# Patient Record
Sex: Male | Born: 1965
Health system: Southern US, Community
[De-identification: ages and names within clinical notes are randomized; demographics above are authoritative.]

## PROBLEM LIST (undated history)

## (undated) DIAGNOSIS — E119 Type 2 diabetes mellitus without complications: Secondary | ICD-10-CM

## (undated) DIAGNOSIS — N201 Calculus of ureter: Secondary | ICD-10-CM

## (undated) DIAGNOSIS — I10 Essential (primary) hypertension: Secondary | ICD-10-CM

## (undated) DIAGNOSIS — N400 Enlarged prostate without lower urinary tract symptoms: Secondary | ICD-10-CM

## (undated) DIAGNOSIS — E785 Hyperlipidemia, unspecified: Secondary | ICD-10-CM

## (undated) HISTORY — DX: Hyperlipidemia, unspecified: E78.5

## (undated) HISTORY — PX: BACK SURGERY: SHX140

## (undated) HISTORY — PX: LITHOTRIPSY: SUR834

## (undated) HISTORY — DX: Essential (primary) hypertension: I10

---

## 2004-04-24 ENCOUNTER — Emergency Department: Payer: Self-pay | Admitting: General Practice

## 2005-05-27 ENCOUNTER — Ambulatory Visit: Payer: Self-pay

## 2006-03-12 ENCOUNTER — Ambulatory Visit: Payer: Self-pay

## 2006-03-25 ENCOUNTER — Other Ambulatory Visit: Payer: Self-pay

## 2006-03-26 ENCOUNTER — Ambulatory Visit: Payer: Self-pay | Admitting: Urology

## 2006-10-17 ENCOUNTER — Ambulatory Visit: Payer: Self-pay | Admitting: Emergency Medicine

## 2007-02-20 IMAGING — CR ORBITS FOR FOREIGN BODY - 2 VIEW
1 series · 3 of 3 positions shown · non-contrast
Comparison: none

REASON FOR EXAM: Evaluate orbits for metallic foreign body prior to MRI
COMMENTS:

PROCEDURE:     DXR - DXR ORBITS FOR MRI CLEARANCE  - May 27, 2005  [DATE]
RESULT:          Multiple views were obtained which reveals no radiopaque
foreign bodies within the orbits to preclude an MRI examination.

[Series 1: view not recorded · 0.17mm/px · 3 of 3 slices shown]
[im 1/3]
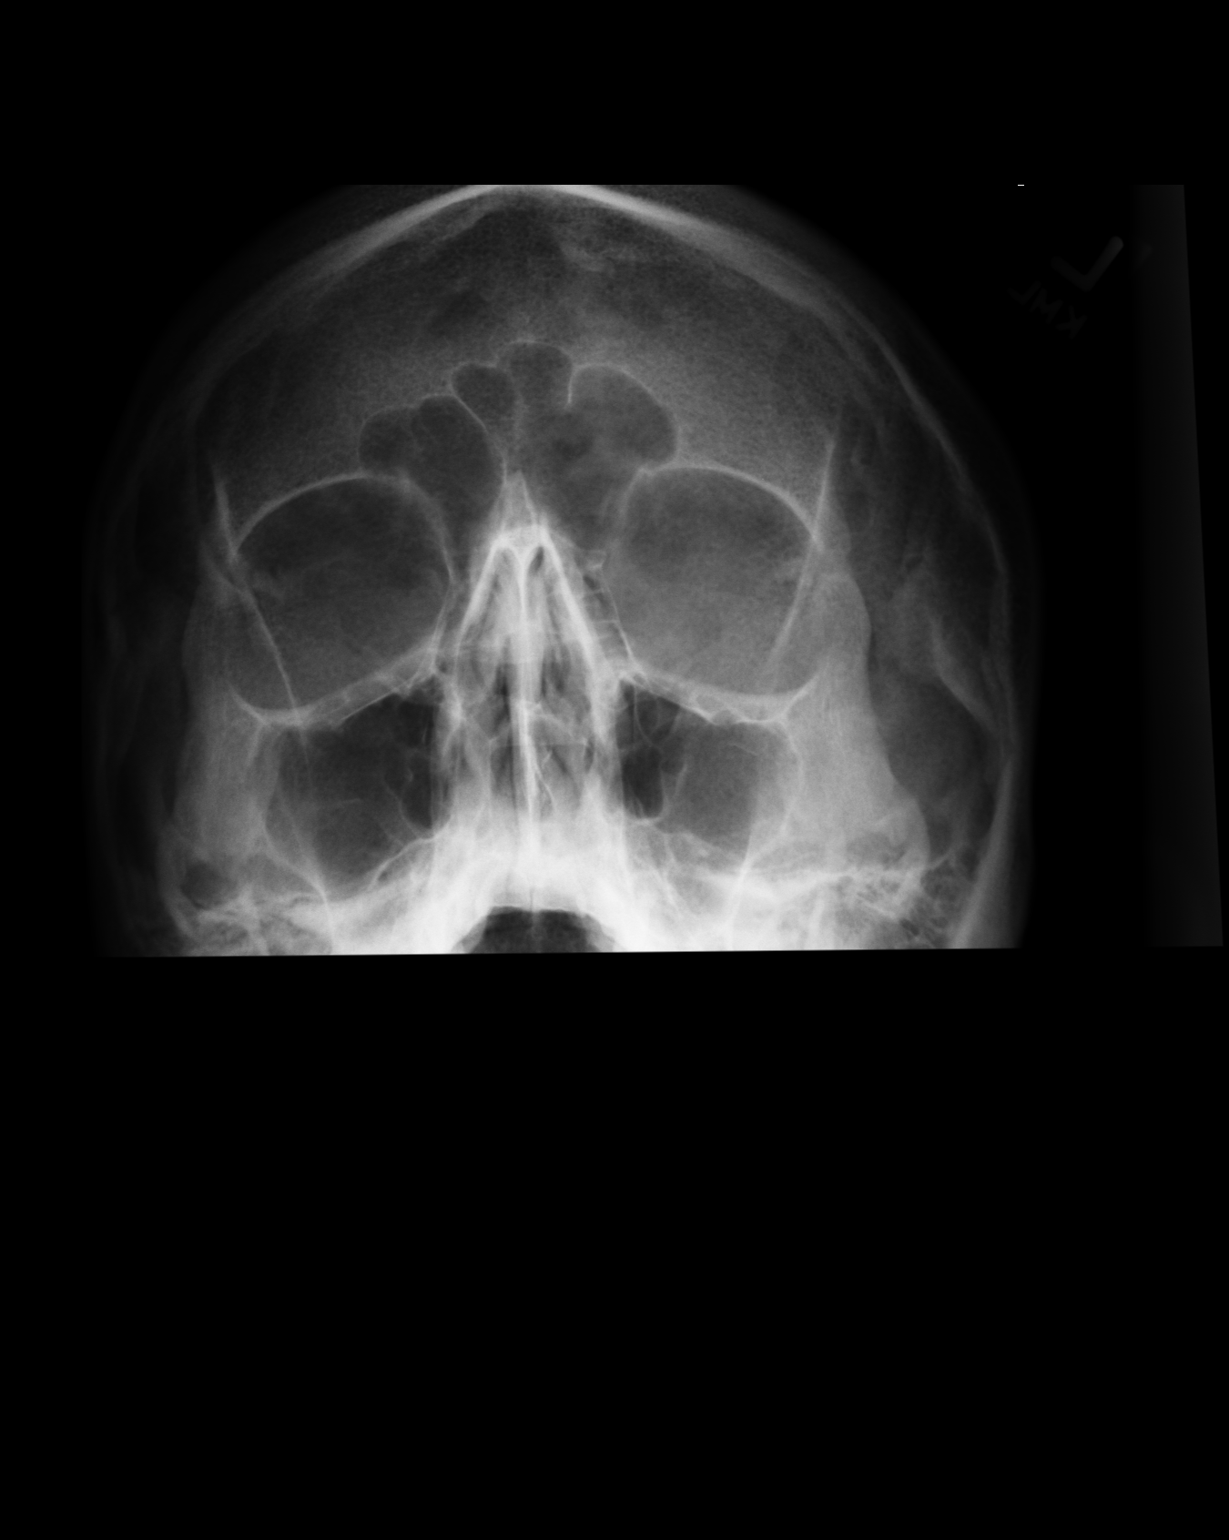
[im 2/3]
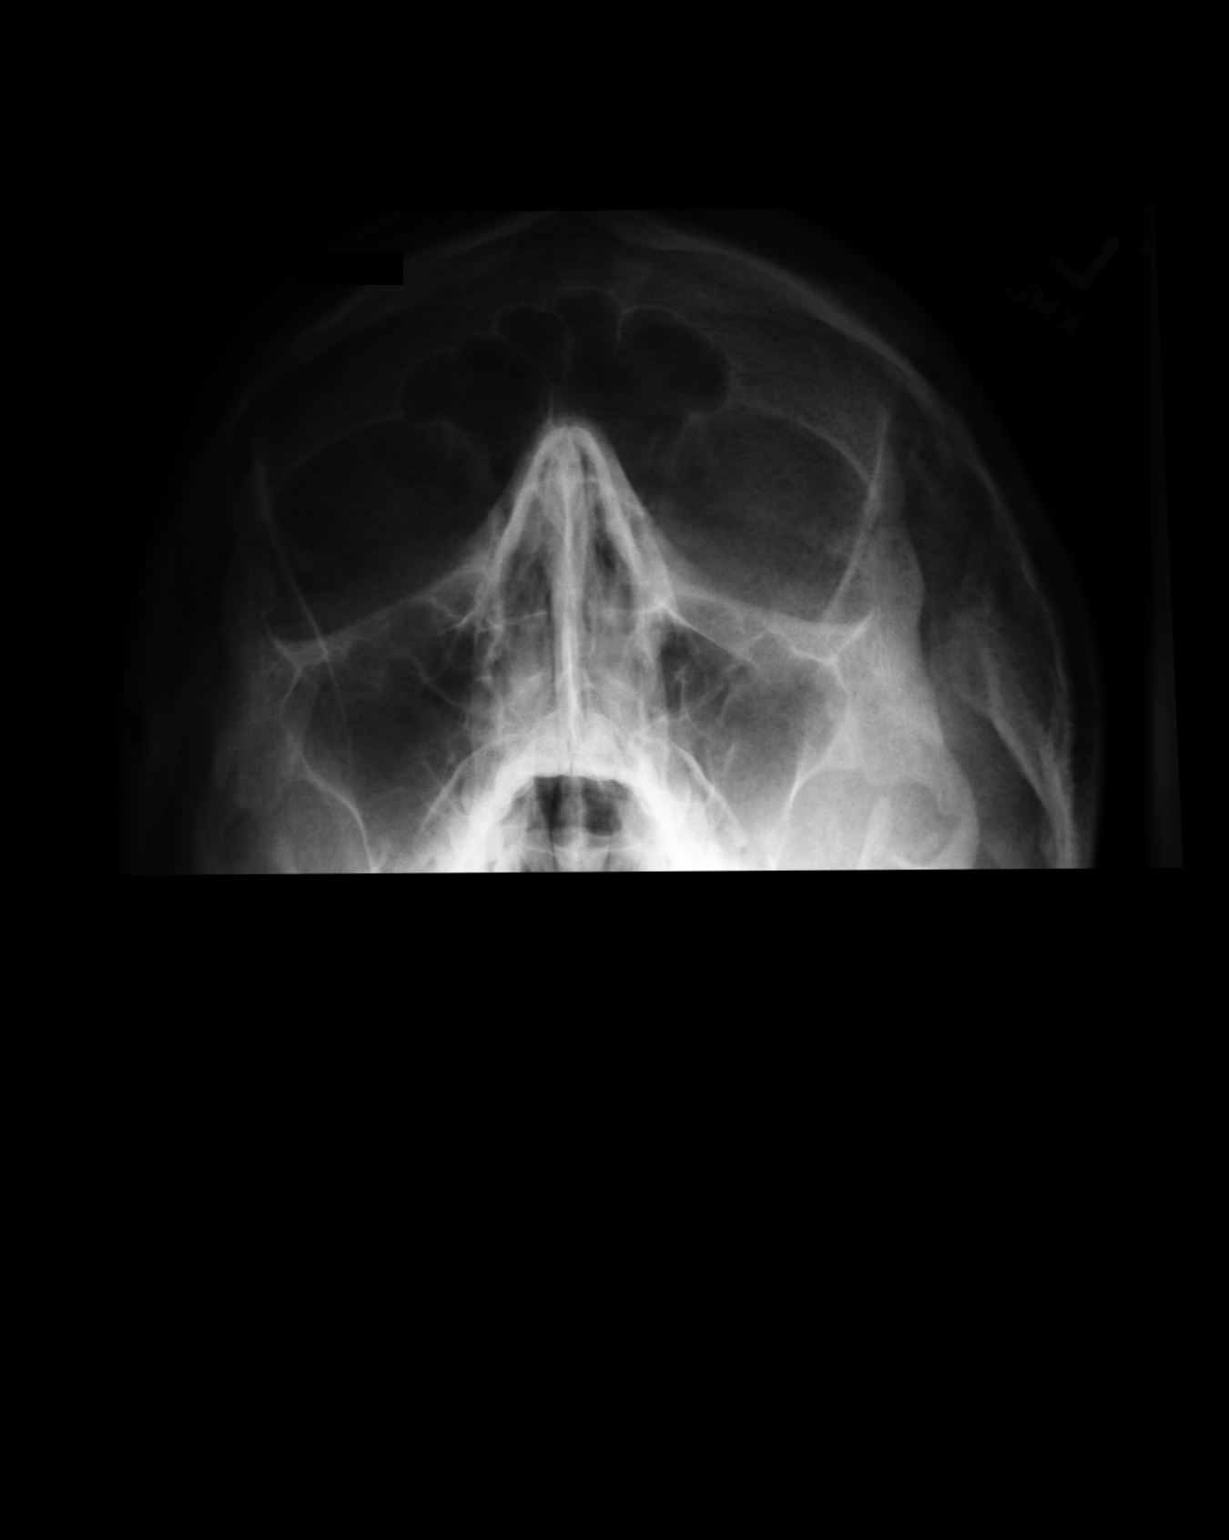
[im 3/3]
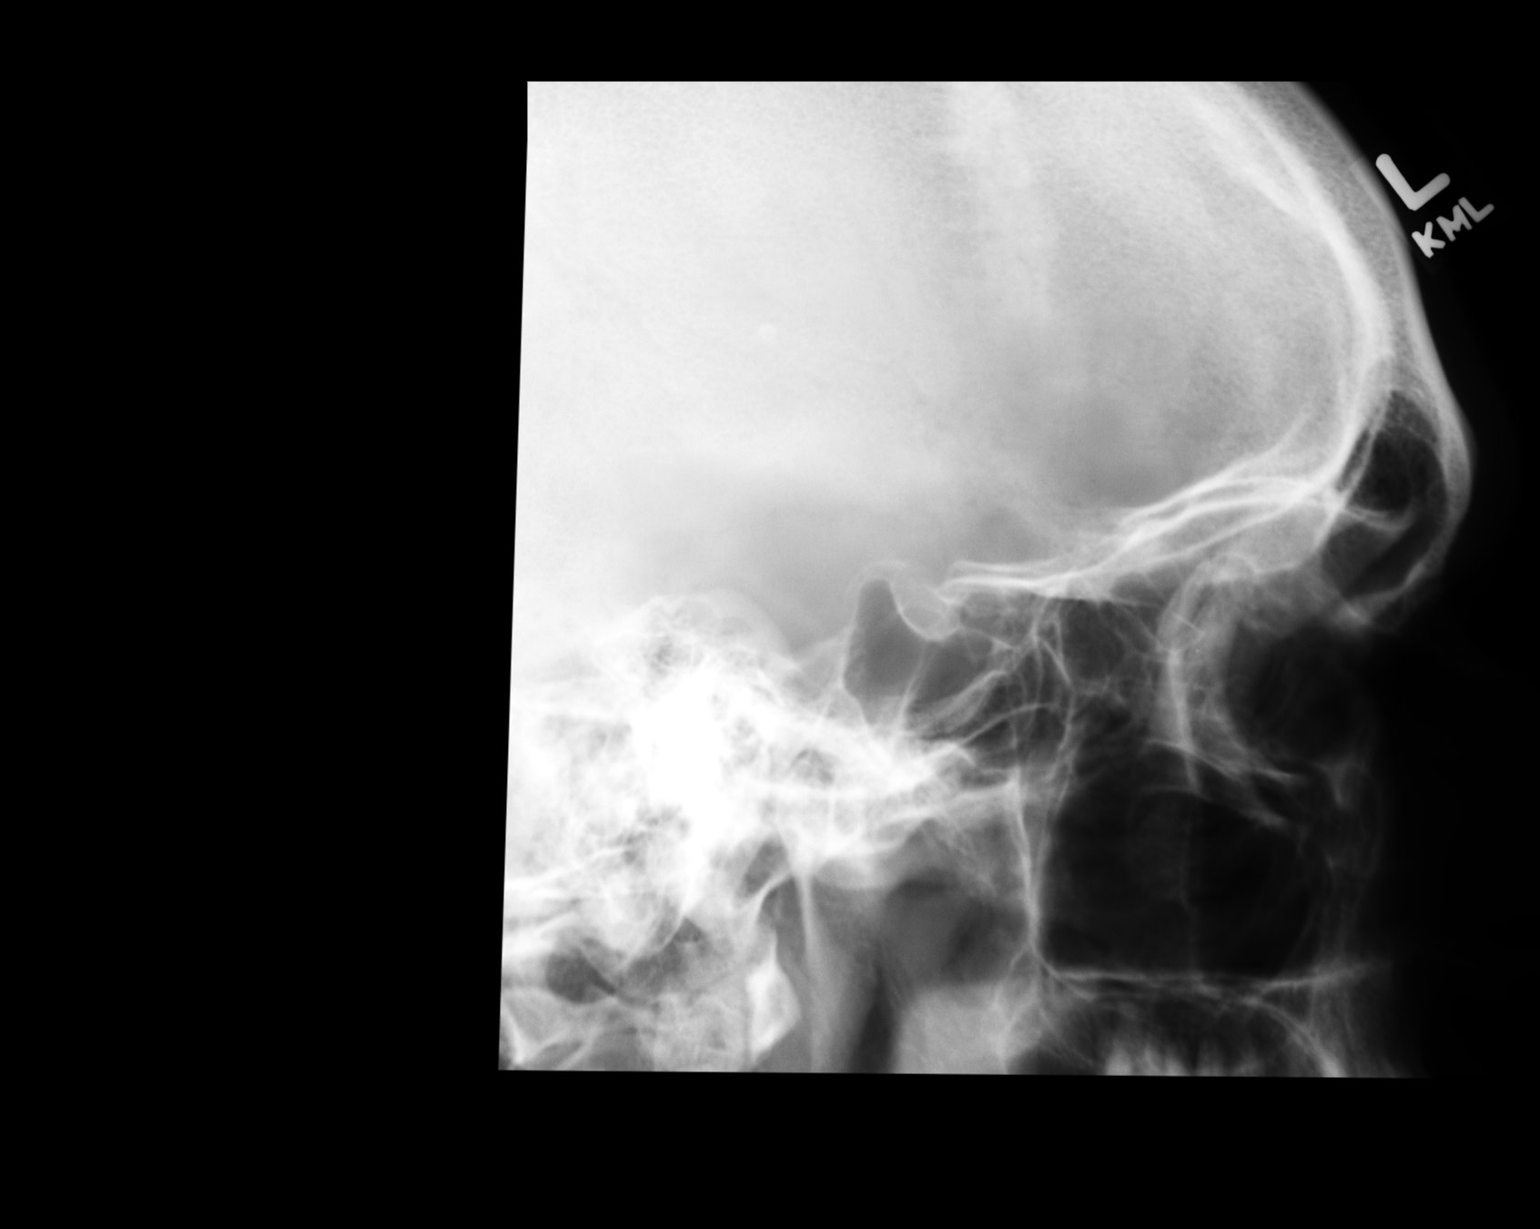

[3 of 3 positions shown; findings below may reference images not displayed]

IMPRESSION: As above.

## 2007-09-14 ENCOUNTER — Ambulatory Visit: Payer: Self-pay | Admitting: Urology

## 2007-09-15 ENCOUNTER — Ambulatory Visit: Payer: Self-pay | Admitting: Urology

## 2007-09-16 ENCOUNTER — Ambulatory Visit: Payer: Self-pay | Admitting: Urology

## 2007-12-20 IMAGING — CR DG ABDOMEN 1V
1 series · 1 of 1 positions shown · non-contrast
Comparison: none

REASON FOR EXAM: Renal calculi, lithotripsy
COMMENTS:

[view not recorded]
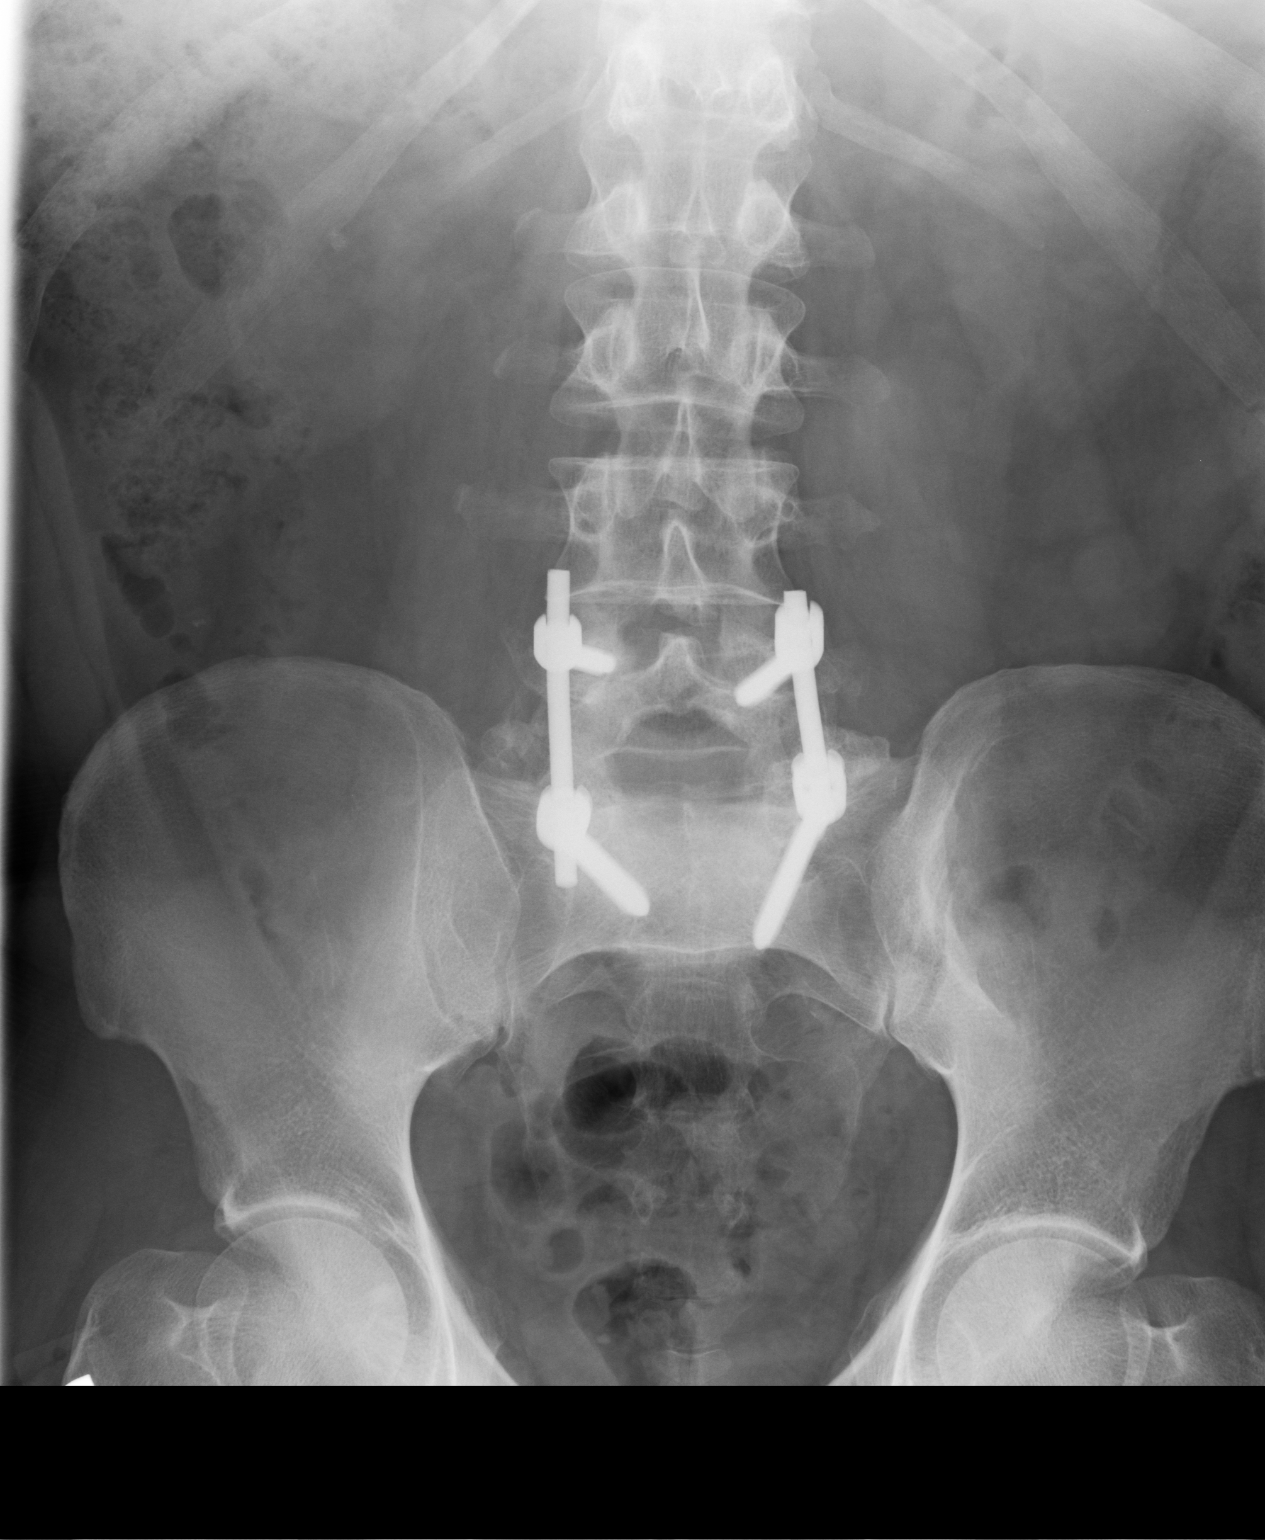

[1 of 1 positions shown; findings below may reference images not displayed]

PROCEDURE:     DXR - DXR KIDNEY URETER BLADDER  - March 26, 2006  [DATE]

RESULT:     AP view of the abdomen shows a calcification in the lower mid
pole region on the RIGHT compatible with a RIGHT renal stone as reported on
prior CT. The mid pole calcification on the LEFT, noted at CT, is not
definitely seen. There is a faint density present in the mid pole region
overlying the eleventh rib which may represent the stone noted at CT but
this is not definite. No ureteral stones are seen. There are post operative
changes of the lower lumbar spine.
IMPRESSION: 1.     There is observed a RIGHT renal stone as noted above.
2.     Possible faint, LEFT renal stone.

## 2009-05-28 ENCOUNTER — Ambulatory Visit: Payer: Self-pay | Admitting: Urology

## 2009-06-11 IMAGING — CR DG ABDOMEN 1V
1 series · 1 of 1 positions shown · non-contrast
Comparison: none

REASON FOR EXAM: Renal calculi-lithotripsy
COMMENTS:

[view not recorded]
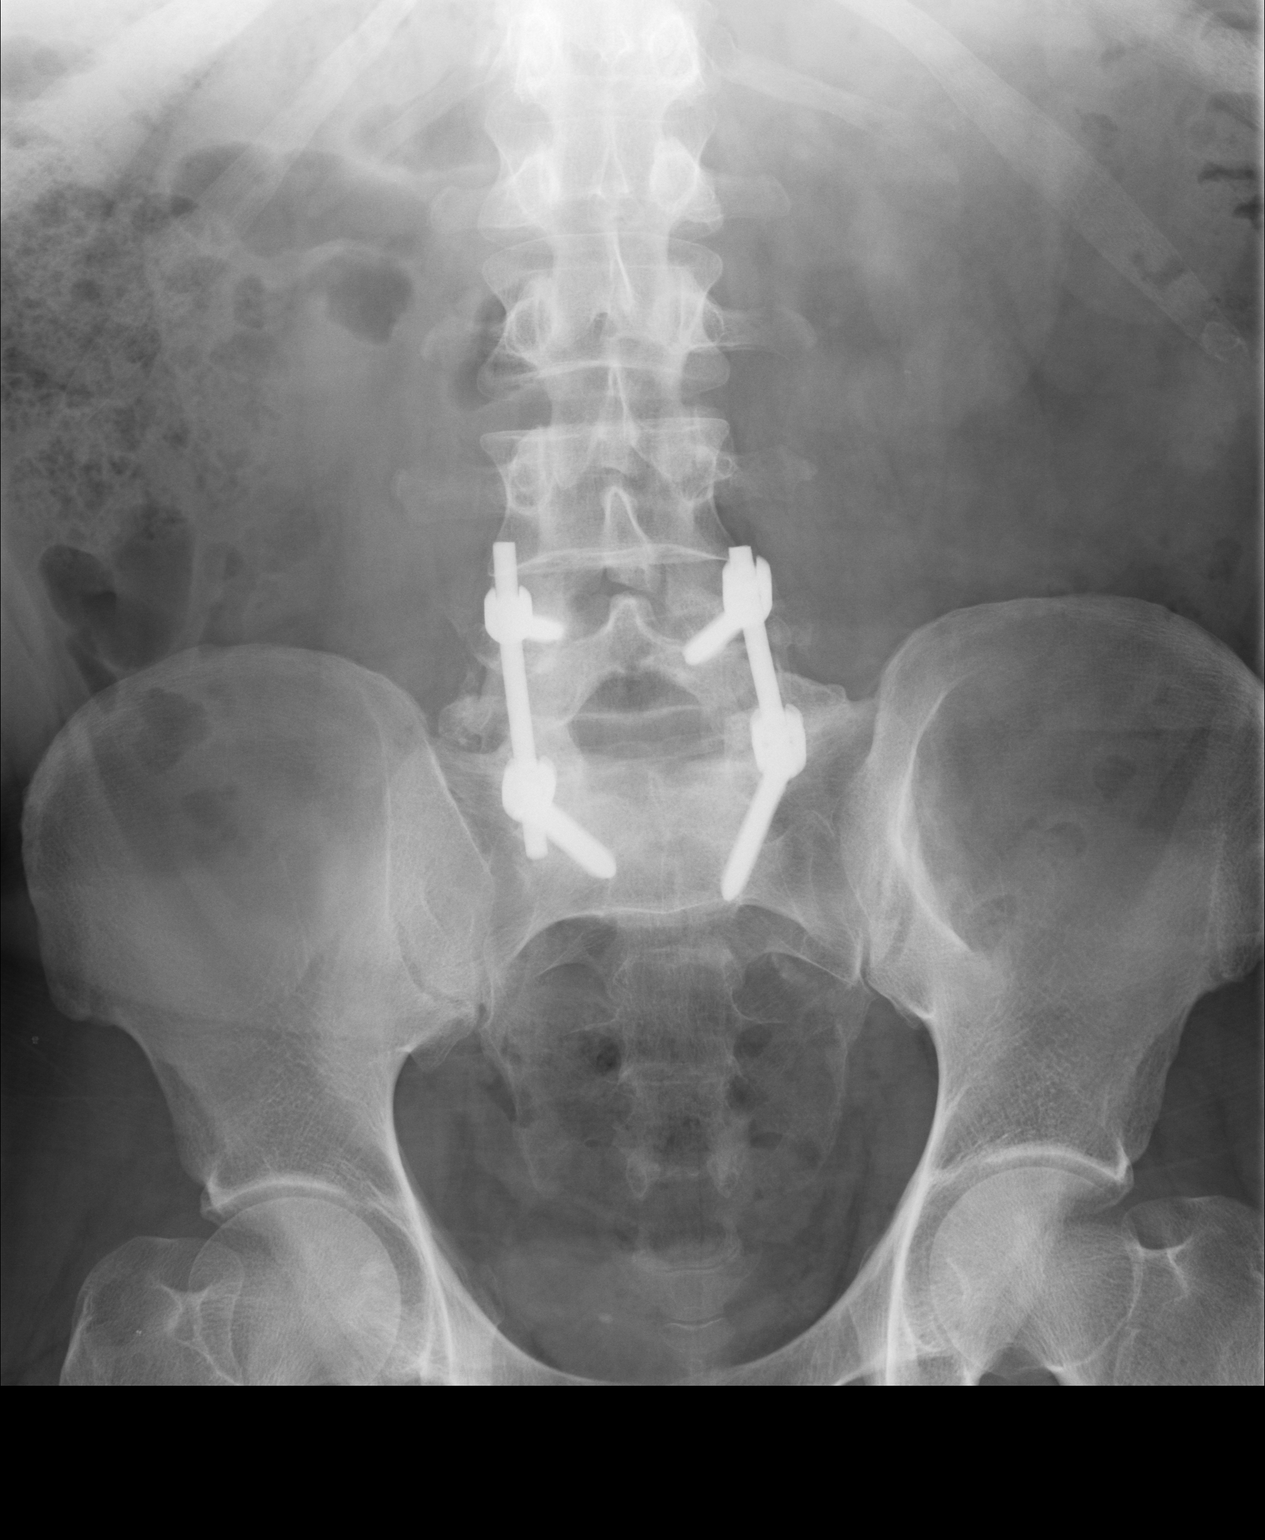

[1 of 1 positions shown; findings below may reference images not displayed]

PROCEDURE:     DXR - DXR KIDNEY URETER BLADDER  - September 16, 2007  [DATE]

RESULT:     Comparison is made to the prior exam of 03/26/2006.

The lower pole, RIGHT renal stone noted on the prior exam is not definitely
seen on the current study. The patient has known bilateral nephrolithiasis
from recent CT but the calcifications noted at CT are not definitely evident
on the current KUB. Post operative changes of the lower lumbar spine are
noted. There is a stable appearing lucent area in the LEFT ilium.
IMPRESSION: The patient has known nephrolithiasis. The bilateral renal
stones are not definitely identified on plain film examination.

## 2011-10-06 ENCOUNTER — Emergency Department: Payer: Self-pay | Admitting: Emergency Medicine

## 2013-07-01 IMAGING — CR DG LUMBAR SPINE 2-3V
1 series · 3 of 3 positions shown · non-contrast
Comparison: none

REASON FOR EXAM: low back pain, h/o fusion
COMMENTS:

[Series 1: t lumbar spine ap · 0.14mm/px · 3 of 3 slices shown]
[im 1/3]
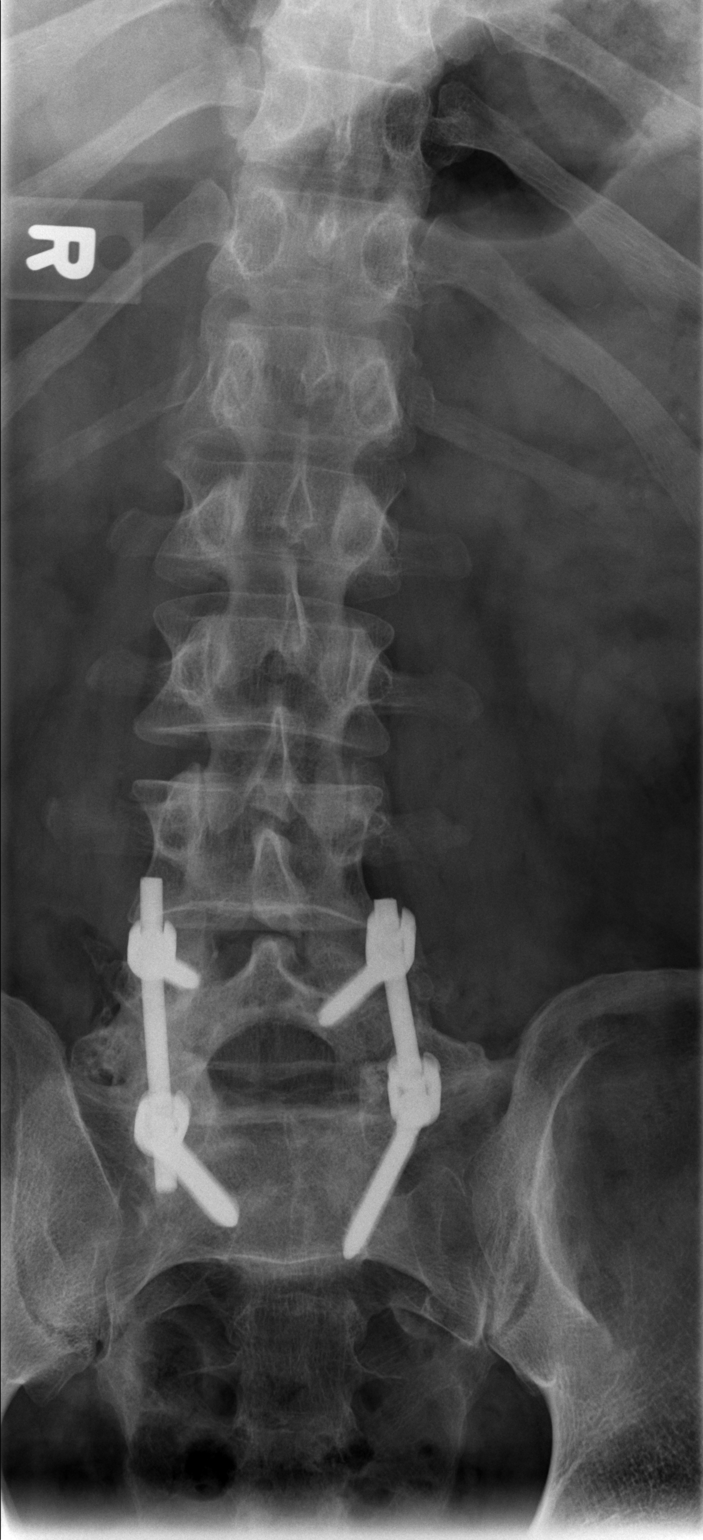
[im 2/3]
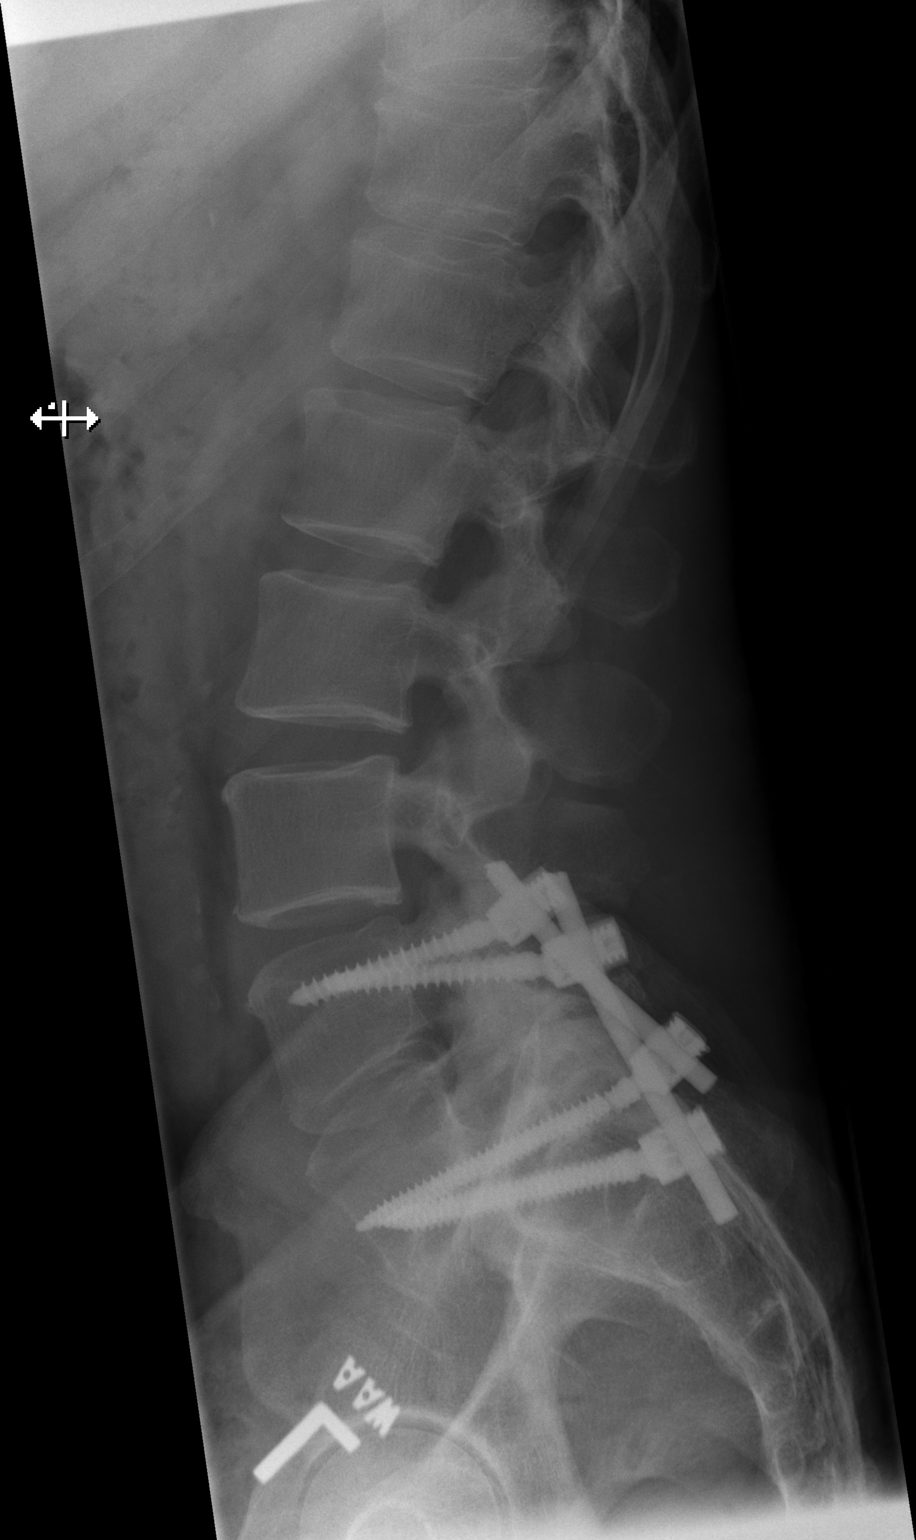
[im 3/3]
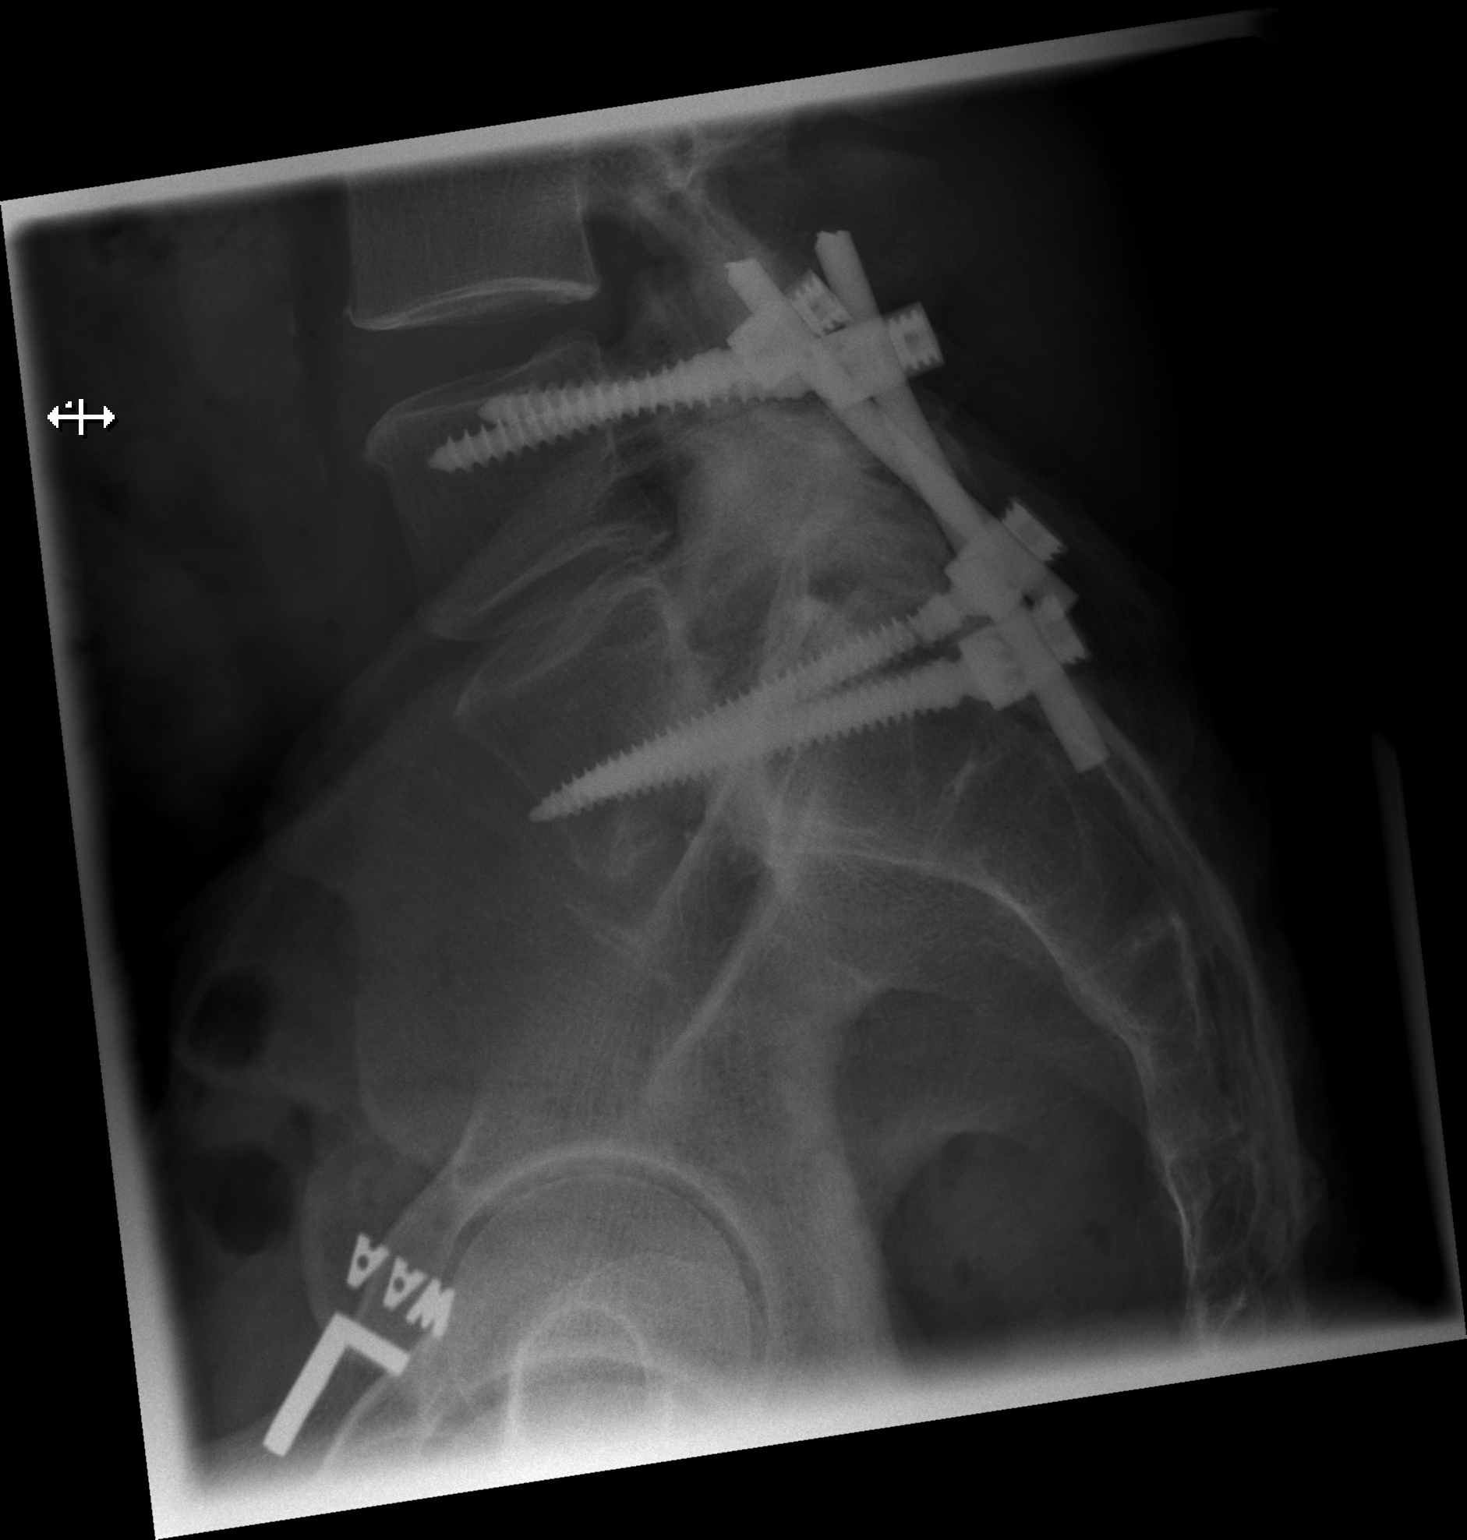

[3 of 3 positions shown; findings below may reference images not displayed]

PROCEDURE:     DXR - DXR LUMBAR SPINE AP AND LATERAL  - October 06, 2011 [DATE]

RESULT:     The lumbar vertebral bodies are preserved in height. There is
grade 3 anterolisthesis of L5 with respect to S1. The patient has undergone
previous posterior spinal fusion. One of the lower screws through the sacrum
appears broken. The intervertebral disc space heights in the upper and mid
lumbar spine are preserved. L4-L5 exhibits no more than mild narrowing. The
L5-S1 disc is disrupted with the anterolisthesis.
IMPRESSION: There is severe anterolisthesis at L5-S1. There is a
fracture through one of the pedicle screws through the upper sacrum.
Orthopedic evaluation is recommended.

## 2014-06-09 ENCOUNTER — Ambulatory Visit: Payer: Self-pay | Admitting: Gastroenterology

## 2016-10-02 ENCOUNTER — Encounter: Payer: Self-pay | Admitting: Urology

## 2016-10-02 ENCOUNTER — Ambulatory Visit
Admission: RE | Admit: 2016-10-02 | Discharge: 2016-10-02 | Disposition: A | Discharge status: Home / Self Care | Payer: BC Managed Care – PPO | Source: Ambulatory Visit | Attending: Urology | Admitting: Urology

## 2016-10-02 ENCOUNTER — Ambulatory Visit (INDEPENDENT_AMBULATORY_CARE_PROVIDER_SITE_OTHER): Payer: BC Managed Care – PPO | Admitting: Urology

## 2016-10-02 VITALS — BP 116/72 | HR 79 | Ht 71.0 in | Wt 228.6 lb

## 2016-10-02 DIAGNOSIS — N200 Calculus of kidney: Secondary | ICD-10-CM

## 2016-10-02 DIAGNOSIS — Z9889 Other specified postprocedural states: Secondary | ICD-10-CM | POA: Insufficient documentation

## 2016-10-02 LAB — MICROSCOPIC EXAMINATION
BACTERIA UA: NONE SEEN
EPITHELIAL CELLS (NON RENAL): NONE SEEN /HPF (ref 0–10)

## 2016-10-02 LAB — URINALYSIS, COMPLETE
Bilirubin, UA: NEGATIVE
Glucose, UA: NEGATIVE
Ketones, UA: NEGATIVE
LEUKOCYTES UA: NEGATIVE
Nitrite, UA: NEGATIVE
PH UA: 5.5 (ref 5.0–7.5)
PROTEIN UA: NEGATIVE
Specific Gravity, UA: 1.02 (ref 1.005–1.030)
UUROB: 0.2 mg/dL (ref 0.2–1.0)

## 2016-10-02 MED ORDER — TAMSULOSIN HCL 0.4 MG PO CAPS: 0.4000 mg | ORAL_CAPSULE | Freq: Every day | ORAL | 0 refills | Status: DC

## 2016-10-02 NOTE — Progress Notes (Signed)
10/02/2016 11:40 AM   Bradley Lynch [birth year] [id number]  Referring provider: No referring provider defined for this encounter.  Chief Complaint  Patient presents with  . Nephrolithiasis     HPI: The patient is a 51 year old male with a past medical history of nephrolithiasis who presents today for urgent work and as he feels that he is currently passing a stone.  His pain began 2 days ago and significant worsened yesterday. It is located in his left flank. He notes that he feels very similar previous episodes of renal colic. He denies nausea vomiting fevers and chills. He still having colicky intermittent left flank pain. It has been a number of years since he last had a stone or imaging. He has had ureteroscopy and lithotripsy the past. He is needed surgery for at least 8 stone episodes as well as having passed stones spontaneously. His stones are made of calcium oxalate. He had a negative metabolic workup at [facility] many years ago.  We arranged for him to undergo a KUB today which I have independently reviewed which showed a 1 mm calculus in the left upper pole of the kidney.  The radiologist, there are no other stones visualized. However on my review, there is an approximately 3 mm calcification in the expected path of the left ureter which potentially represents a distal ureteral calculus.  Urinalysis today shows no hematuria.  PMH: Past Medical History:  Diagnosis Date  . Hyperlipidemia   . Hypertension     Surgical History: Past Surgical History:  Procedure Laterality Date  . BACK SURGERY    . LITHOTRIPSY      Home Medications:  Allergies as of 10/02/2016      Reactions   Iodinated Diagnostic Agents       Medication List       Accurate as of 10/02/16 11:40 AM. Always use your most recent med list.          aspirin EC 81 MG tablet Take by mouth.   cetirizine 10 MG chewable tablet Commonly known as:  ZYRTEC Chew by mouth.   felodipine 5 MG 24 hr  tablet Commonly known as:  PLENDIL Take by mouth.   FISH OIL PO Take by mouth.   fluticasone 50 MCG/ACT nasal spray Commonly known as:  FLONASE Place into the nose.   gabapentin 300 MG capsule Commonly known as:  NEURONTIN 300 mg 2 (two) times daily.   lisinopril-hydrochlorothiazide 10-12.5 MG tablet Commonly known as:  PRINZIDE,ZESTORETIC Take by mouth.   simvastatin 40 MG tablet Commonly known as:  ZOCOR Take by mouth.   tamsulosin 0.4 MG Caps capsule Commonly known as:  FLOMAX Take 1 capsule (0.4 mg total) by mouth daily.       Allergies:  Allergies  Allergen Reactions  . Iodinated Diagnostic Agents     Family History: Family History  Problem Relation Age of Onset  . Bladder Cancer Neg Hx   . Prostate cancer Neg Hx   . Kidney cancer Neg Hx     Social History:  reports that he has quit smoking. He has quit using smokeless tobacco. He reports that he drinks alcohol. He reports that he does not use drugs.  ROS: UROLOGY Frequent Urination?: Yes Hard to postpone urination?: Yes Burning/pain with urination?: No Get up at night to urinate?: No Leakage of urine?: No Urine stream starts and stops?: No Trouble starting stream?: No Do you have to strain to urinate?: No Blood in urine?: No Urinary tract infection?:  No Sexually transmitted disease?: No Injury to kidneys or bladder?: No Painful intercourse?: No Weak stream?: No Erection problems?: No Penile pain?: No  Gastrointestinal Nausea?: No Vomiting?: No Indigestion/heartburn?: No Diarrhea?: No Constipation?: No  Constitutional Fever: No Night sweats?: No Weight loss?: No Fatigue?: No  Skin Skin rash/lesions?: Yes Itching?: Yes  Eyes Blurred vision?: No Double vision?: No  Ears/Nose/Throat Sore throat?: No Sinus problems?: Yes  Hematologic/Lymphatic Swollen glands?: No Easy bruising?: Yes  Cardiovascular Leg swelling?: No Chest pain?: No  Respiratory Cough?: No Shortness  of breath?: No  Endocrine Excessive thirst?: No  Musculoskeletal Back pain?: Yes Joint pain?: No  Neurological Headaches?: Yes Dizziness?: No  Psychologic Depression?: No Anxiety?: No  Physical Exam: BP 116/72 (BP Location: Left Arm, Patient Position: Sitting, Cuff Size: Normal)   Pulse 79   Ht 5\' 11"  (1.803 m)   Wt 228 lb 9.6 oz (103.7 kg)   BMI 31.88 kg/m   Constitutional:  Alert and oriented, No acute distress. HEENT: [place] AT, moist mucus membranes.  Trachea midline, no masses. Cardiovascular: No clubbing, cyanosis, or edema. Respiratory: Normal respiratory effort, no increased work of breathing. GI: Abdomen is soft, nontender, nondistended, no abdominal masses GU: Left CVA tenderness.  Skin: No rashes, bruises or suspicious lesions. Lymph: No cervical or inguinal adenopathy. Neurologic: Grossly intact, no focal deficits, moving all 4 extremities. Psychiatric: Normal mood and affect.  Laboratory Data: No results found for: WBC, HGB, HCT, MCV, PLT  No results found for: CREATININE  No results found for: PSA  No results found for: TESTOSTERONE  No results found for: HGBA1C  Urinalysis No results found for: COLORURINE, APPEARANCEUR, LABSPEC, PHURINE, GLUCOSEU, HGBUR, BILIRUBINUR, KETONESUR, PROTEINUR, UROBILINOGEN, NITRITE, LEUKOCYTESUR   Assessment & Plan:    1. Left flank pain 2. Left 1 mm nonobstructing upper pole stone 3. Possible 3 mm distal left ureteral calculus I discussed the patient that his KUB was somewhat inconclusive though there is a potential distal left ureteral calculus. I discussed options with him which include assuming that it is a stone and beginning medical expulsive therapy with tamsulosin versus obtaining a CT scan for more definitive imaging. At this point, the patient would like to try Flomax and repeat his KUB in approximately 2 weeks. I think this is very reasonable. I did discuss with the patient that he would need more definitive  imaging via CT scan if he did elect to undergo any surgical treatment for the potential distal left ureteral stone. He will follow-up in 2 weeks and will be started on Flomax. Hopefully this calcified area is no longer present on repeat KUB.   Return in about 2 weeks (around 10/16/2016) for KUB prior.  Hildred LaserBrian Cloyce Iyla Balzarini, MD  [facility] [address], [address] [place], [place] [address] [phone]

## 2016-10-24 ENCOUNTER — Ambulatory Visit: Payer: BC Managed Care – PPO

## 2018-06-28 IMAGING — CR DG ABDOMEN 1V
1 series · 2 of 2 positions shown · non-contrast
Comparison: May 28, 2009

CLINICAL DATA: Abdominal pain

EXAM:
ABDOMEN - 1 VIEW

[Series 1: dg abd 1 view · 0.14mm/px · 2 of 2 slices shown]
[im 1/2]
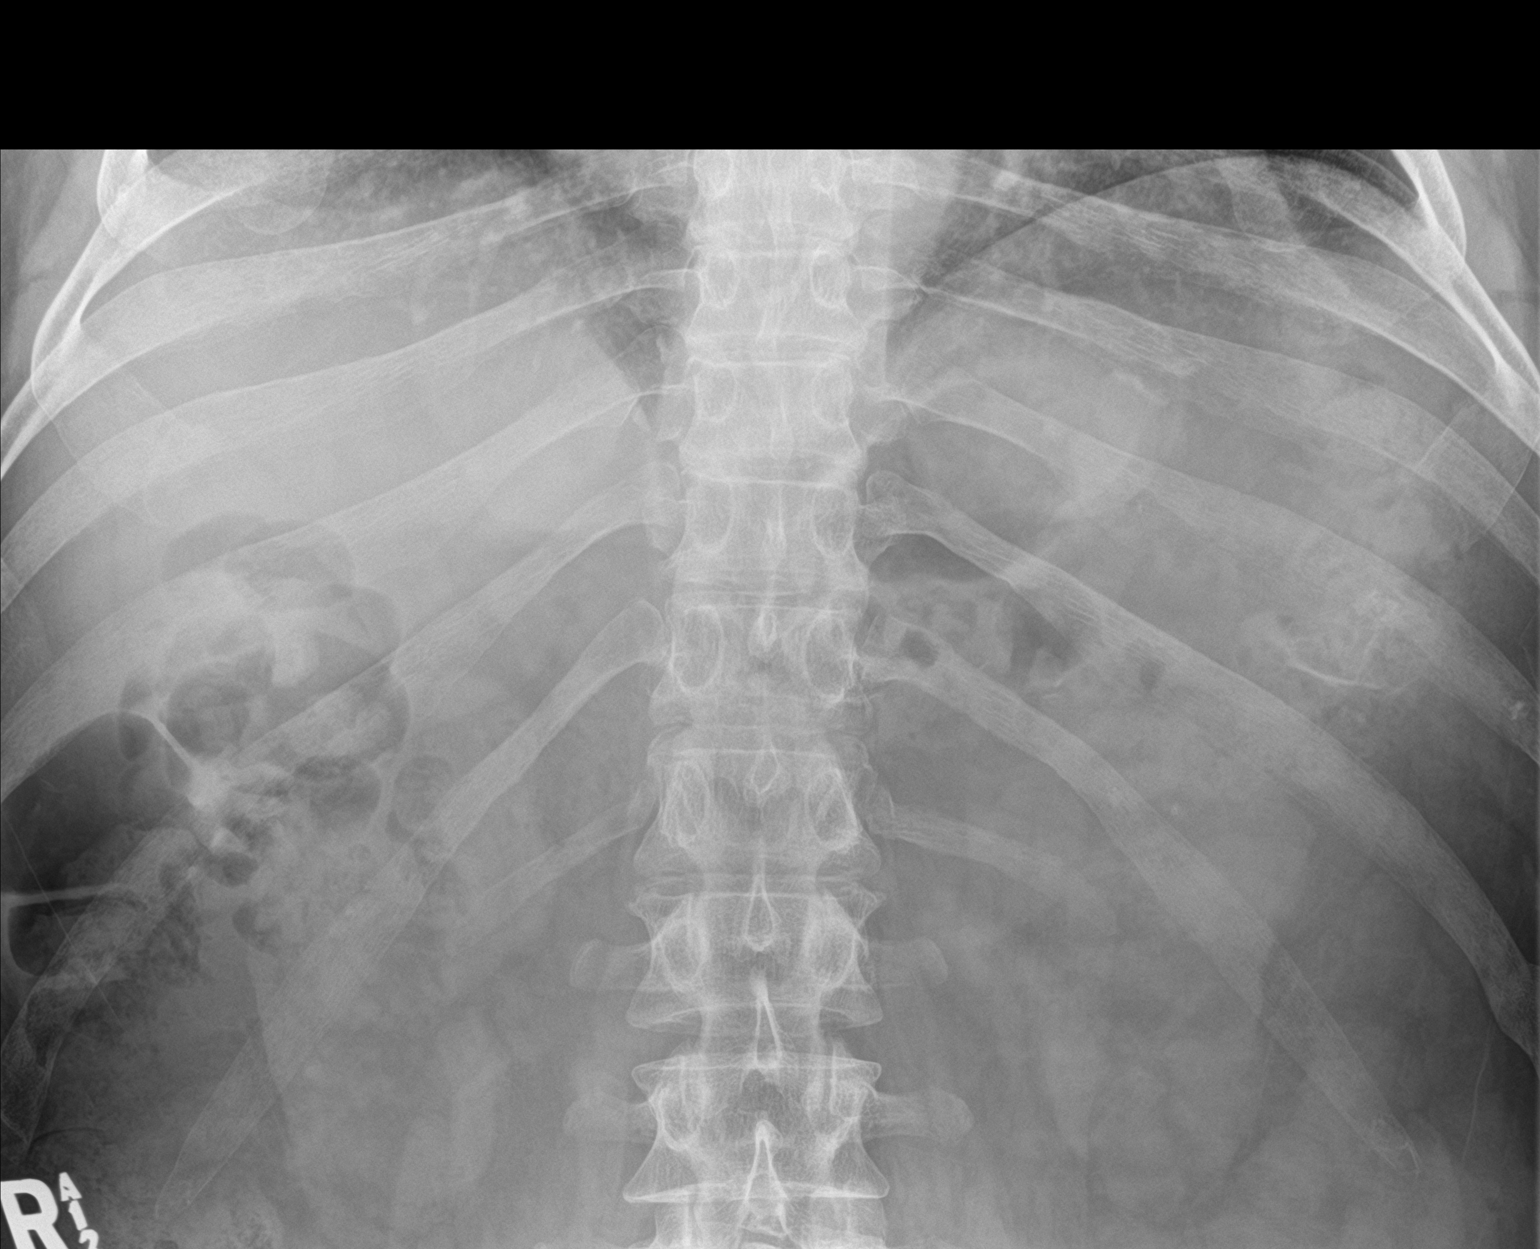
[im 2/2]
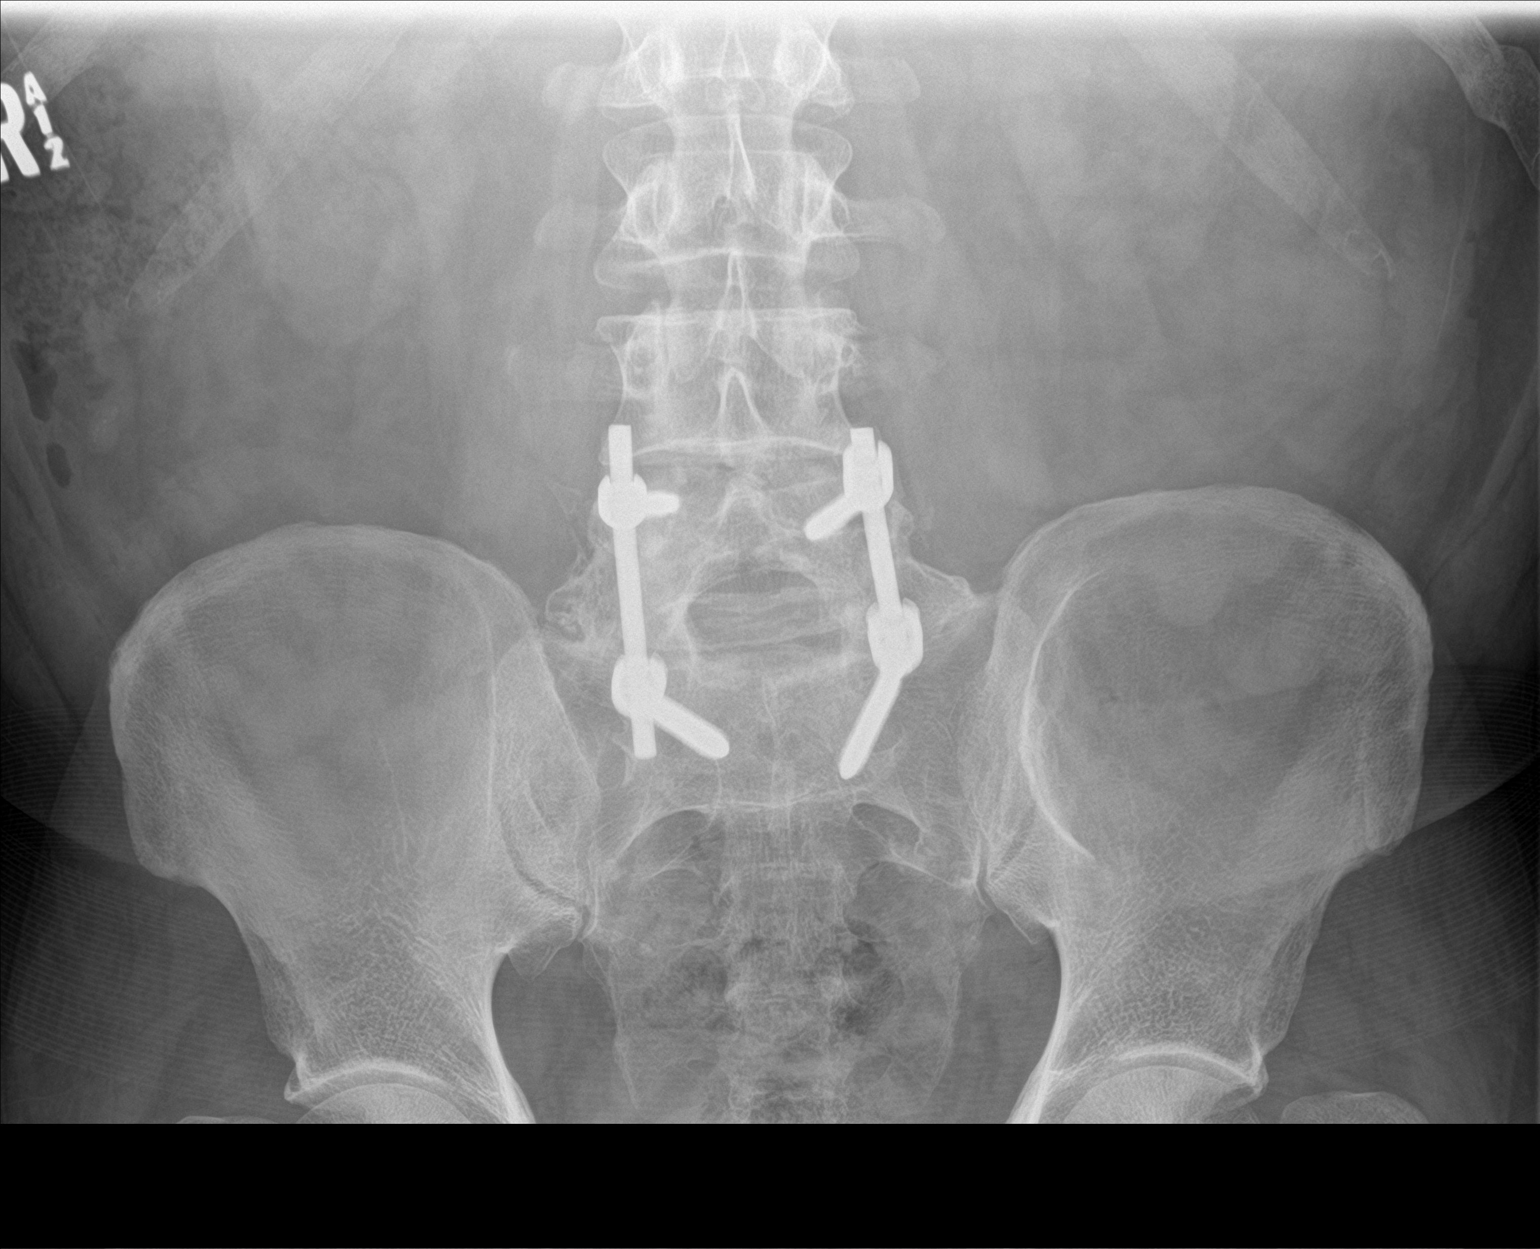

[2 of 2 positions shown; findings below may reference images not displayed]

FINDINGS: There is a 1 mm calcification in the region of the upper pole of the
left kidney. No other abnormal calcifications are evident. There is
moderate stool in the colon. There is no bowel dilatation or
air-fluid level suggesting bowel obstruction. No free air. Lung
bases are clear. There is postoperative change at L5 and S1.
IMPRESSION: 1 mm calculus upper pole left kidney. No bowel obstruction or free
air. Postoperative change at lumbosacral junction.

## 2019-07-28 ENCOUNTER — Encounter: Payer: Self-pay | Admitting: Urology

## 2019-07-28 ENCOUNTER — Other Ambulatory Visit: Payer: Self-pay

## 2019-07-28 ENCOUNTER — Ambulatory Visit
Admission: RE | Admit: 2019-07-28 | Discharge: 2019-07-28 | Disposition: A | Discharge status: Home / Self Care | Payer: BC Managed Care – PPO | Attending: Urology | Admitting: Urology

## 2019-07-28 ENCOUNTER — Encounter
Admission: RE | Disposition: A | Discharge status: Home / Self Care | Payer: Self-pay | Source: Home / Self Care | Attending: Urology

## 2019-07-28 DIAGNOSIS — N132 Hydronephrosis with renal and ureteral calculous obstruction: Secondary | ICD-10-CM | POA: Insufficient documentation

## 2019-07-28 DIAGNOSIS — E119 Type 2 diabetes mellitus without complications: Secondary | ICD-10-CM | POA: Insufficient documentation

## 2019-07-28 DIAGNOSIS — N200 Calculus of kidney: Secondary | ICD-10-CM | POA: Diagnosis present

## 2019-07-28 DIAGNOSIS — I10 Essential (primary) hypertension: Secondary | ICD-10-CM | POA: Diagnosis not present

## 2019-07-28 DIAGNOSIS — Z91041 Radiographic dye allergy status: Secondary | ICD-10-CM | POA: Diagnosis not present

## 2019-07-28 DIAGNOSIS — Z7982 Long term (current) use of aspirin: Secondary | ICD-10-CM | POA: Insufficient documentation

## 2019-07-28 DIAGNOSIS — Z7984 Long term (current) use of oral hypoglycemic drugs: Secondary | ICD-10-CM | POA: Insufficient documentation

## 2019-07-28 DIAGNOSIS — Z79899 Other long term (current) drug therapy: Secondary | ICD-10-CM | POA: Diagnosis not present

## 2019-07-28 DIAGNOSIS — Z87891 Personal history of nicotine dependence: Secondary | ICD-10-CM | POA: Insufficient documentation

## 2019-07-28 HISTORY — PX: EXTRACORPOREAL SHOCK WAVE LITHOTRIPSY: SHX1557

## 2019-07-28 LAB — GLUCOSE, CAPILLARY: Glucose-Capillary: 136 mg/dL — ABNORMAL HIGH (ref 70–99)

## 2019-07-28 SURGERY — LITHOTRIPSY, ESWL
Anesthesia: Moderate Sedation | Laterality: Left

## 2019-07-28 MED ORDER — DIPHENHYDRAMINE HCL 25 MG PO CAPS: 25.0000 mg | ORAL_CAPSULE | Freq: Once | ORAL | Status: AC

## 2019-07-28 MED ORDER — PROMETHAZINE HCL 25 MG/ML IJ SOLN: 25.0000 mg | Freq: Once | INTRAMUSCULAR | Status: AC

## 2019-07-28 MED ORDER — MIDAZOLAM HCL 2 MG/2ML IJ SOLN: 1.0000 mg | Freq: Once | INTRAMUSCULAR | Status: AC

## 2019-07-28 MED ORDER — PROMETHAZINE HCL 25 MG/ML IJ SOLN
INTRAMUSCULAR | Status: AC
  Administered 2019-07-28: 25 mg via INTRAMUSCULAR
  Filled 2019-07-28: qty 1

## 2019-07-28 MED ORDER — MIDAZOLAM HCL 2 MG/2ML IJ SOLN
INTRAMUSCULAR | Status: AC
  Administered 2019-07-28: 1 mg via INTRAMUSCULAR
  Filled 2019-07-28: qty 2

## 2019-07-28 MED ORDER — HYDROMORPHONE HCL 2 MG PO TABS
2.0000 mg | ORAL_TABLET | Freq: Once | ORAL | Status: AC
  Administered 2019-07-28: 2 mg via ORAL
  Filled 2019-07-28: qty 1

## 2019-07-28 MED ORDER — HYDROMORPHONE HCL 2 MG PO TABS
ORAL_TABLET | ORAL | Status: AC
  Filled 2019-07-28: qty 1

## 2019-07-28 MED ORDER — LEVOFLOXACIN 500 MG PO TABS
ORAL_TABLET | ORAL | Status: AC
  Administered 2019-07-28: 11:00:00 500 mg via ORAL
  Filled 2019-07-28: qty 1

## 2019-07-28 MED ORDER — MORPHINE SULFATE (PF) 10 MG/ML IV SOLN
INTRAVENOUS | Status: AC
  Administered 2019-07-28: 10 mg
  Filled 2019-07-28: qty 1

## 2019-07-28 MED ORDER — DEXTROSE-NACL 5-0.45 % IV SOLN: INTRAVENOUS | Status: DC

## 2019-07-28 MED ORDER — DIPHENHYDRAMINE HCL 25 MG PO CAPS
ORAL_CAPSULE | ORAL | Status: AC
  Administered 2019-07-28: 25 mg via ORAL
  Filled 2019-07-28: qty 1

## 2019-07-28 MED ORDER — FUROSEMIDE 10 MG/ML IJ SOLN
INTRAMUSCULAR | Status: AC
  Filled 2019-07-28: qty 2

## 2019-07-28 MED ORDER — LEVOFLOXACIN 500 MG PO TABS: 500.0000 mg | ORAL_TABLET | Freq: Once | ORAL | Status: AC

## 2019-07-28 MED ORDER — FUROSEMIDE 10 MG/ML IJ SOLN
10.0000 mg | Freq: Once | INTRAMUSCULAR | Status: AC
  Administered 2019-07-28: 10 mg via INTRAVENOUS

## 2019-07-28 MED ORDER — MORPHINE SULFATE (PF) 2 MG/ML IV SOLN
10.0000 mg | Freq: Once | INTRAVENOUS | Status: DC
  Filled 2019-07-28: qty 5

## 2019-07-28 NOTE — Discharge Instructions (Addendum)
Lithotripsy, Care After This sheet gives you information about how to care for yourself after your procedure. Your health care provider may also give you more specific instructions. If you have problems or questions, contact your health care provider. What can I expect after the procedure? After the procedure, it is common to have:  Some blood in your urine. This should only last for a few days.  Soreness in your back, sides, or upper abdomen for a few days.  Blotches or bruises on your back where the pressure wave entered the skin.  Pain, discomfort, or nausea when pieces (fragments) of the kidney [place] move through the tube that carries urine from the kidney to the bladder (ureter). [place] fragments may pass soon after the procedure, but they may continue to pass for up to 4-8 weeks. ? If you have severe pain or nausea, contact your health care provider. This may be caused by a large [place] that was not broken up, and this may mean that you need more treatment.  Some pain or discomfort during urination.  Some pain or discomfort in the lower abdomen or (in men) at the base of the penis. Follow these instructions at home: Medicines  Take over-the-counter and prescription medicines only as told by your health care provider.  If you were prescribed an antibiotic medicine, take it as told by your health care provider. Do not stop taking the antibiotic even if you start to feel better.  Do not drive for 24 hours if you were given a medicine to help you relax (sedative).  Do not drive or use heavy machinery while taking prescription pain medicine. Eating and drinking      Drink enough water and fluids to keep your urine clear or pale yellow. This helps any remaining pieces of the [place] to pass. It can also help prevent new stones from forming.  Eat plenty of fresh fruits and vegetables.  Follow instructions from your health care provider about eating and drinking restrictions. You may be  instructed: ? To reduce how much salt (sodium) you eat or drink. Check ingredients and nutrition facts on packaged foods and beverages. ? To reduce how much meat you eat.  Eat the recommended amount of calcium for your age and gender. Ask your health care provider how much calcium you should have. General instructions  Get plenty of rest.  Most people can resume normal activities 1-2 days after the procedure. Ask your health care provider what activities are safe for you.  Your health care provider may direct you to lie in a certain position (postural drainage) and tap firmly (percuss) over your kidney area to help [place] fragments pass. Follow instructions as told by your health care provider.  If directed, strain all urine through the strainer that was provided by your health care provider. ? Keep all fragments for your health care provider to see. Any stones that are found may be sent to a medical lab for examination. The [place] may be as small as a grain of salt.  Keep all follow-up visits as told by your health care provider. This is important. Contact a health care provider if:  You have pain that is severe or does not get better with medicine.  You have nausea that is severe or does not go away.  You have blood in your urine longer than your health care provider told you to expect.  You have more blood in your urine.  You have pain during urination that does   not go away.  You urinate more frequently than usual and this does not go away.  You develop a rash or any other possible signs of an allergic reaction. Get help right away if:  You have severe pain in your back, sides, or upper abdomen.  You have severe pain while urinating.  Your urine is very dark red.  You have blood in your stool (feces).  You cannot pass any urine at all.  You feel a strong urge to urinate after emptying your bladder.  You have a fever or chills.  You develop shortness of breath,  difficulty breathing, or chest pain.  You have severe nausea that leads to persistent vomiting.  You faint. Summary  After this procedure, it is common to have some pain, discomfort, or nausea when pieces (fragments) of the kidney stone move through the tube that carries urine from the kidney to the bladder (ureter). If this pain or nausea is severe, however, you should contact your health care provider.  Most people can resume normal activities 1-2 days after the procedure. Ask your health care provider what activities are safe for you.  Drink enough water and fluids to keep your urine clear or pale yellow. This helps any remaining pieces of the stone to pass, and it can help prevent new stones from forming.  If directed, strain your urine and keep all fragments for your health care provider to see. Fragments or stones may be as small as a grain of salt.  Get help right away if you have severe pain in your back, sides, or upper abdomen or have severe pain while urinating. This information is not intended to replace advice given to you by your health care provider. Make sure you discuss any questions you have with your health care provider. Document Revised: 06/28/2018 Document Reviewed: 02/06/2016 Elsevier Patient Education  2020 [employer]   AMBULATORY SURGERY  DISCHARGE INSTRUCTIONS   1) The drugs that you were given will stay in your system until tomorrow so for the next 24 hours you should not:  A) Drive an automobile B) Make any legal decisions C) Drink any alcoholic beverage   2) You may resume regular meals tomorrow.  Today it is better to start with liquids and gradually work up to solid foods.  You may eat anything you prefer, but it is better to start with liquids, then soup and crackers, and gradually work up to solid foods.   3) Please notify your doctor immediately if you have any unusual bleeding, trouble breathing, redness and pain at the surgery site,  drainage, fever, or pain not relieved by medication.    4) Additional Instructions:   FOLLOW [facility] INSTRUCTION SHEET AS REVIEWED.        Please contact your physician with any problems or Same Day Surgery at [phone], Monday through Friday 6 am to 4 pm, or [place] at [facility] number at [phone].

## 2019-12-21 ENCOUNTER — Other Ambulatory Visit: Payer: BC Managed Care – PPO

## 2019-12-21 DIAGNOSIS — Z20822 Contact with and (suspected) exposure to covid-19: Secondary | ICD-10-CM

## 2019-12-24 LAB — NOVEL CORONAVIRUS, NAA: SARS-CoV-2, NAA: NOT DETECTED

## 2019-12-28 ENCOUNTER — Other Ambulatory Visit: Payer: BC Managed Care – PPO

## 2019-12-28 DIAGNOSIS — Z20822 Contact with and (suspected) exposure to covid-19: Secondary | ICD-10-CM

## 2019-12-29 LAB — SARS-COV-2, NAA 2 DAY TAT

## 2019-12-29 LAB — NOVEL CORONAVIRUS, NAA: SARS-CoV-2, NAA: NOT DETECTED

## 2020-01-04 ENCOUNTER — Other Ambulatory Visit: Payer: BC Managed Care – PPO

## 2020-01-04 DIAGNOSIS — Z20822 Contact with and (suspected) exposure to covid-19: Secondary | ICD-10-CM

## 2020-01-05 LAB — SARS-COV-2, NAA 2 DAY TAT

## 2020-01-05 LAB — NOVEL CORONAVIRUS, NAA: SARS-CoV-2, NAA: NOT DETECTED

## 2020-01-11 ENCOUNTER — Other Ambulatory Visit: Payer: BC Managed Care – PPO

## 2020-01-11 DIAGNOSIS — Z20822 Contact with and (suspected) exposure to covid-19: Secondary | ICD-10-CM

## 2020-01-12 LAB — NOVEL CORONAVIRUS, NAA: SARS-CoV-2, NAA: NOT DETECTED

## 2020-01-12 LAB — SARS-COV-2, NAA 2 DAY TAT

## 2020-01-18 ENCOUNTER — Other Ambulatory Visit: Payer: BC Managed Care – PPO

## 2020-01-18 DIAGNOSIS — Z20822 Contact with and (suspected) exposure to covid-19: Secondary | ICD-10-CM

## 2020-01-19 LAB — SARS-COV-2, NAA 2 DAY TAT

## 2020-01-19 LAB — NOVEL CORONAVIRUS, NAA: SARS-CoV-2, NAA: NOT DETECTED

## 2020-01-25 ENCOUNTER — Other Ambulatory Visit: Payer: BC Managed Care – PPO

## 2020-01-25 DIAGNOSIS — Z20822 Contact with and (suspected) exposure to covid-19: Secondary | ICD-10-CM

## 2020-01-26 LAB — NOVEL CORONAVIRUS, NAA: SARS-CoV-2, NAA: NOT DETECTED

## 2020-01-26 LAB — SARS-COV-2, NAA 2 DAY TAT

## 2022-04-15 ENCOUNTER — Encounter: Payer: Self-pay | Admitting: Urology

## 2022-04-15 ENCOUNTER — Ambulatory Visit (INDEPENDENT_AMBULATORY_CARE_PROVIDER_SITE_OTHER): Payer: BC Managed Care – PPO | Admitting: Urology

## 2022-04-15 VITALS — BP 145/86 | HR 106 | Ht 71.0 in | Wt 226.6 lb

## 2022-04-15 DIAGNOSIS — Z87442 Personal history of urinary calculi: Secondary | ICD-10-CM | POA: Diagnosis not present

## 2022-04-15 DIAGNOSIS — N529 Male erectile dysfunction, unspecified: Secondary | ICD-10-CM | POA: Diagnosis not present

## 2022-04-15 DIAGNOSIS — N200 Calculus of kidney: Secondary | ICD-10-CM

## 2022-04-15 MED ORDER — TADALAFIL 20 MG PO TABS: 20.0000 mg | ORAL_TABLET | ORAL | 6 refills | Status: DC

## 2022-04-15 MED ORDER — TADALAFIL 20 MG PO TABS: 20.0000 mg | ORAL_TABLET | ORAL | 6 refills | Status: AC

## 2022-04-15 NOTE — Progress Notes (Signed)
   04/15/22 12:40 PM   Jeneen Rinks Chelsea Aus [birth year] [id number]  CC: Erectile dysfunction, low testosterone, history of nephrolithiasis  HPI: 57 year old male who reports 1 to 2 years of problems with erectile dysfunction and maintaining erections.  He was reportedly trialed on sildenafil, Cialis, Levitra by PCP with only minimal improvement, the doses of those medications are not available to me.  He was using all of them on an as-needed basis.  A free testosterone was borderline low, but no total testosterone labs have been drawn.  He also reports an extensive history of kidney stones requiring at least 10 shockwave procedures in the past.  He reportedly has had multiple 71-GGYI urine metabolic test as well, and he says these were not helpful.   PMH: Past Medical History:  Diagnosis Date   Hyperlipidemia    Hypertension     Surgical History: Past Surgical History:  Procedure Laterality Date   BACK SURGERY     EXTRACORPOREAL SHOCK WAVE LITHOTRIPSY Left 07/28/2019   Procedure: EXTRACORPOREAL SHOCK WAVE LITHOTRIPSY (ESWL);  Surgeon: Royston Cowper, MD;  Location: [facility] ORS;  Service: Urology;  Laterality: Left;   LITHOTRIPSY       Family History: Family History  Problem Relation Age of Onset   Bladder Cancer Neg Hx    Prostate cancer Neg Hx    Kidney cancer Neg Hx     Social History:  reports that he has quit smoking. He has quit using smokeless tobacco. He reports current alcohol use. He reports that he does not use drugs.  Physical Exam: BP (!) 145/86 (BP Location: Left Arm, Patient Position: Sitting, Cuff Size: Large)   Pulse (!) 106   Ht 5\' 11"  (1.803 m)   Wt 226 lb 9.6 oz (102.8 kg)   BMI 31.60 kg/m    Constitutional:  Alert and oriented, No acute distress. Cardiovascular: No clubbing, cyanosis, or edema. Respiratory: Normal respiratory effort, no increased work of breathing. GI: Abdomen is soft, nontender, nondistended, no abdominal masses  Assessment & Plan:    57 year old male with at least 1 to 2 years of problems with erections.  We reviewed the AUA guidelines, and I also recommended checking a morning total testosterone.  I recommended starting with Cialis 20 mg every other day to see if we can improve some of the erections while we await further workup with the testosterone lab.  We also reviewed behavioral strategies including more of a plant-based diet, exercise, reducing stress, adequate sleep.  We discussed general stone prevention strategies including adequate hydration with goal of producing 2.5 L of urine daily, increasing citric acid intake, increasing calcium intake during high oxalate meals, minimizing animal protein, and decreasing salt intake. Information about dietary recommendations given today.   -Trial of Cialis 20 mg every other day -Check morning testosterone level, if low will coordinate follow-up with PA for repeat labs and consider testosterone replacement -Stone prevention strategies discussed, consider yearly KUB in the future with his recurrent stone disease  Nickolas Madrid, MD 04/15/2022  [place] [address], [place], [place] [address] [phone]

## 2022-04-15 NOTE — Patient Instructions (Addendum)
The Benefits of a Plant-Based Diet for Urology Health  A plant-based diet emphasizes the consumption of whole, unprocessed plant foods while minimizing or excluding animal products including meat and dairy products. This dietary approach has gained attention for its potential to promote overall health, including urology-related conditions. Incorporating a plant-based diet into your lifestyle can offer numerous benefits for maintaining optimal urology health.  1. Reduced Risk of Kidney Stones: A plant-based diet is typically rich in fruits, vegetables, legumes, and whole grains. These foods are high in dietary fiber, potassium, and magnesium, which can help reduce the risk of developing kidney stones. Be careful to avoid high quantities of spinach, as these can contribute to kidney stone formation if eaten in large volumes. The increased intake of water-soluble fiber can enhance the excretion of waste products and prevent the crystallization of minerals that lead to stone formation.  2. Improved Prostate Health: Studies have suggested a link between the consumption of red and processed meats and an increased risk of prostate problems, including benign prostatic hyperplasia (BPH) and prostate cancer. By adopting a plant-based diet, you can lower your intake of saturated fats and decrease the risk of these conditions. PSA levels can often decrease on plant based diets! Plant foods are also rich in antioxidants and phytochemicals that have been associated with prostate health.  3. Better Bladder Function: A diet focused on plant-based foods can contribute to better bladder health by reducing the risk of urinary tract infections (UTIs). Berries, citrus fruits, and leafy greens are known for their high vitamin C content, which can acidify urine and create an environment less favorable for bacteria growth. Additionally, plant-based diets are generally lower in sodium, which can help prevent fluid retention and  reduce the strain on the bladder.  4. Management of Erectile Dysfunction (ED): Some research suggests that a plant-based diet can positively impact erectile function. Plant-based diets are associated with improved cardiovascular health, which is crucial for maintaining healthy blood flow and nerve function required for proper erectile function. By reducing the consumption of high-cholesterol and high-saturated fat animal products, a plant-based diet may contribute to a decreased risk of ED.  5. Prevention of Chronic Conditions: A plant-based diet can help prevent or manage chronic conditions such as obesity, diabetes, and hypertension. These conditions can contribute to urology-related issues, including urinary incontinence and kidney dysfunction. By maintaining a healthy weight and managing these conditions, you can reduce the risk of urology-related complications.  Conclusion: Embracing a plant-based diet can offer significant benefits for urology health. By incorporating a variety of colorful fruits, vegetables, whole grains, nuts, seeds, and legumes into your meals, you can support kidney health, prostate health, bladder function, and overall well-being. Remember to consult with a healthcare professional or registered dietitian before making any significant dietary changes, especially if you have existing health conditions. Your personalized approach to a plant-based diet can contribute to improved urology health and enhance your quality of life.       Hypogonadism, Male  Male hypogonadism is a condition of having a level of testosterone that is lower than normal. Testosterone is a chemical, or hormone, that is made mainly in the testicles. In boys, testosterone is responsible for the development of male characteristics during puberty. These include: Making the penis bigger. Growing and building the muscles. Growing facial hair. Deepening the voice. In adult men, testosterone is  responsible for maintaining: An interest in sex and the ability to have sex. Muscle mass. Sperm production. Red blood cell production. Bone strength.  Testosterone also gives men energy and a sense of well-being. Testosterone normally decreases as men age and the testicles make less testosterone. Testosterone levels can vary from man to man. Not all men will have signs and symptoms of low testosterone. Weight, alcohol use, medicines, and certain medical conditions can affect a man's testosterone level. What are the causes? This condition is caused by: A natural decrease in testosterone that occurs as a man grows older. This is the main cause of this condition. Use of medicines, such as antidepressants, steroids, and opioids. Diseases and conditions that affect the testicles or the making of testosterone. These include: Injury or damage to the testicles from trauma, cancer, cancer treatment, or infection. Diabetes. Sleep apnea. Genetic conditions that men are born with. Disease of the pituitary gland. This gland is in the brain. It produces hormones. Obesity. Metabolic syndrome. This is a group of diseases that affect blood pressure, blood sugar, cholesterol, and belly fat. HIV or AIDS. Alcohol abuse. Kidney failure. Other long-term or chronic diseases. What are the signs or symptoms? Common symptoms of this condition include: Loss of interest in sex (low sex drive). Inability to have or maintain an erection (erectile dysfunction). Feeling tired (fatigue). Mood changes, like irritability or depression. Loss of muscle and body hair. Infertility. Large breasts. Weight gain (obesity). How is this diagnosed? Your health care provider can diagnose hypogonadism based on: Your signs and symptoms. A physical exam to check your testosterone levels. This includes blood tests. Testosterone levels can change throughout the day. Levels are highest in the morning. You may need to have repeat  blood tests before getting a diagnosis of hypogonadism. Depending on your medical history and test results, your health care provider may also do other tests to find the cause of low testosterone. How is this treated? This condition is treated with testosterone replacement therapy. Testosterone can be given by: Injection or through pellets inserted under the skin. Gels or patches placed on the skin or in the mouth. Testosterone therapy is not for everyone. It has risks and side effects. Your health care provider will consider your medical history, your risk for prostate cancer, your age, and your symptoms before putting you on testosterone replacement therapy. Follow these instructions at home: Take over-the-counter and prescription medicines only as told by your health care provider. Eat foods that are high in fiber, such as beans, whole grains, and fresh fruits and vegetables. Limit foods that are high in fat and processed sugars, such as fried or sweet foods. If you drink alcohol: Limit how much you have to 0-2 drinks a day. Know how much alcohol is in your drink. In the [place] one drink equals one 12 oz bottle of beer (355 mL), one 5 oz glass of wine (148 mL), or one 1 oz glass of hard liquor (44 mL). Return to your normal activities as told by your health care provider. Ask your health care provider what activities are safe for you. Keep all follow-up visits. This is important. Contact a health care provider if: You have any of the signs or symptoms of low testosterone. You have any side effects from testosterone therapy. Summary Male hypogonadism is a condition of having a level of testosterone that is lower than normal. The natural drop in testosterone production that occurs with age is the most common cause of this condition. Low testosterone can also be caused by many diseases and conditions that affect the testicles and the making of testosterone. This condition is treated with  testosterone replacement therapy. There are risks and side effects of testosterone therapy. Your health care provider will consider your age, medical history, symptoms, and risks for prostate cancer before putting you on testosterone therapy. This information is not intended to replace advice given to you by your health care provider. Make sure you discuss any questions you have with your health care provider. Document Revised: 11/17/2019 Document Reviewed: 11/17/2019 Elsevier Patient Education  [place].  Erectile Dysfunction Erectile dysfunction (ED) is the inability to get or keep an erection in order to have sexual intercourse. ED is considered a symptom of an underlying disorder and is not considered a disease. ED may include: Inability to get an erection. Lack of enough hardness of the erection to allow penetration. Loss of erection before sex is finished. What are the causes? This condition may be caused by: Physical causes, such as: Artery problems. This may include heart disease, high blood pressure, atherosclerosis, and diabetes. Hormonal problems, such as low testosterone. Obesity. Nerve problems. This may include back or pelvic injuries, multiple sclerosis, Parkinson's disease, spinal cord injury, and stroke. Certain medicines, such as: Pain relievers. Antidepressants. Blood pressure medicines and water pills (diuretics). Cancer medicines. Antihistamines. Muscle relaxants. Lifestyle factors, such as: Use of drugs such as marijuana, cocaine, or opioids. Excessive use of alcohol. Smoking. Lack of physical activity or exercise. Psychological causes, such as: Anxiety or stress. Sadness or depression. Exhaustion. Fear about sexual performance. Guilt. What are the signs or symptoms? Symptoms of this condition include: Inability to get an erection. Lack of enough hardness of the erection to allow penetration. Loss of the erection before sex is  finished. Sometimes having normal erections, but with frequent unsatisfactory episodes. Low sexual satisfaction in either partner due to erection problems. A curved penis occurring with erection. The curve may cause pain, or the penis may be too curved to allow for intercourse. Never having nighttime or morning erections. How is this diagnosed? This condition is often diagnosed by: Performing a physical exam to find other diseases or specific problems with the penis. Asking you detailed questions about the problem. Doing tests, such as: Blood tests to check for diabetes mellitus or high cholesterol, or to measure hormone levels. Other tests to check for underlying health conditions. An ultrasound exam to check for scarring. A test to check blood flow to the penis. Doing a sleep study at home to measure nighttime erections. How is this treated? This condition may be treated by: Medicines, such as: Medicine taken by mouth to help you achieve an erection (oral medicine). Hormone replacement therapy to replace low testosterone levels. Medicine that is injected into the penis. Your health care provider may instruct you how to give yourself these injections at home. Medicine that is delivered with a short applicator tube. The tube is inserted into the opening at the tip of the penis, which is the opening of the urethra. A tiny pellet of medicine is put in the urethra. The pellet dissolves and enhances erectile function. This is also called MUSE (medicated urethral system for erections) therapy. Vacuum pump. This is a pump with a ring on it. The pump and ring are placed on the penis and used to create pressure that helps the penis become erect. Penile implant surgery. In this procedure, you may receive: An inflatable implant. This consists of cylinders, a pump, and a reservoir. The cylinders can be inflated with a fluid that helps to create an erection, and they can be deflated after intercourse. A  semi-rigid implant. This consists of two silicone rubber rods. The rods provide some rigidity. They are also flexible, so the penis can both curve downward in its normal position and become straight for sexual intercourse. Blood vessel surgery to improve blood flow to the penis. During this procedure, a blood vessel from a different part of the body is placed into the penis to allow blood to flow around (bypass) damaged or blocked blood vessels. Lifestyle changes, such as exercising more, losing weight, and quitting smoking. Follow these instructions at home: Medicines  Take over-the-counter and prescription medicines only as told by your health care provider. Do not increase the dosage without first discussing it with your health care provider. If you are using self-injections, do injections as directed by your health care provider. Make sure you avoid any veins that are on the surface of the penis. After giving an injection, apply pressure to the injection site for 5 minutes. Talk to your health care provider about how to prevent headaches while taking ED medicines. These medicines may cause a sudden headache due to the increase in blood flow in your body. General instructions Exercise regularly, as directed by your health care provider. Work with your health care provider to lose weight, if needed. Do not use any products that contain nicotine or tobacco. These products include cigarettes, chewing tobacco, and vaping devices, such as e-cigarettes. If you need help quitting, ask your health care provider. Before using a vacuum pump, read the instructions that come with the pump and discuss any questions with your health care provider. Keep all follow-up visits. This is important. Contact a health care provider if: You feel nauseous. You are vomiting. You get sudden headaches while taking ED medicines. You have any concerns about your sexual health. Get help right away if: You are taking oral or  injectable medicines and you have an erection that lasts longer than 4 hours. If your health care provider is unavailable, go to the nearest emergency room for evaluation. An erection that lasts much longer than 4 hours can result in permanent damage to your penis. You have severe pain in your groin or abdomen. You develop redness or severe swelling of your penis. You have redness spreading at your groin or lower abdomen. You are unable to urinate. You experience chest pain or a rapid heartbeat (palpitations) after taking oral medicines. These symptoms may represent a serious problem that is an emergency. Do not wait to see if the symptoms will go away. Get medical help right away. Call your local emergency services (911 in the [place] Do not drive yourself to the hospital. Summary Erectile dysfunction (ED) is the inability to get or keep an erection during sexual intercourse. This condition is diagnosed based on a physical exam, your symptoms, and tests to determine the cause. Treatment varies depending on the cause and may include medicines, hormone therapy, surgery, or a vacuum pump. You may need follow-up visits to make sure that you are using your medicines or devices correctly. Get help right away if you are taking or injecting medicines and you have an erection that lasts longer than 4 hours. This information is not intended to replace advice given to you by your health care provider. Make sure you discuss any questions you have with your health care provider. Document Revised: 06/13/2020 Document Reviewed: 06/13/2020 Elsevier Patient Education  [place].

## 2022-04-22 ENCOUNTER — Other Ambulatory Visit
Admission: RE | Admit: 2022-04-22 | Discharge: 2022-04-22 | Disposition: A | Discharge status: Home / Self Care | Payer: BC Managed Care – PPO | Attending: Urology | Admitting: Urology

## 2022-04-22 DIAGNOSIS — N529 Male erectile dysfunction, unspecified: Secondary | ICD-10-CM | POA: Insufficient documentation

## 2022-04-24 ENCOUNTER — Telehealth: Payer: Self-pay

## 2022-04-24 LAB — TESTOSTERONE: Testosterone: 392 ng/dL (ref 264–916)

## 2022-04-24 NOTE — Telephone Encounter (Signed)
Called pt informed him of the information below. Pt voiced understanding. Appt scheduled.  

## 2022-04-24 NOTE — Telephone Encounter (Signed)
-----  Message from Billey Co, MD sent at 04/24/2022  7:51 AM EST ----- Testosterone normal at 392, continue cialis as prescribed. Rtc  year for KUB, sooner with PA if opts to pursue penile injections for ED  Nickolas Madrid, MD 04/24/2022

## 2022-12-09 ENCOUNTER — Other Ambulatory Visit: Payer: Self-pay | Admitting: *Deleted

## 2022-12-09 DIAGNOSIS — N200 Calculus of kidney: Secondary | ICD-10-CM

## 2022-12-17 ENCOUNTER — Ambulatory Visit
Admission: RE | Admit: 2022-12-17 | Discharge: 2022-12-17 | Disposition: A | Discharge status: Home / Self Care | Payer: BC Managed Care – PPO | Source: Home / Self Care | Attending: Physician Assistant | Admitting: Physician Assistant

## 2022-12-17 ENCOUNTER — Ambulatory Visit
Admission: RE | Admit: 2022-12-17 | Discharge: 2022-12-17 | Disposition: A | Discharge status: Home / Self Care | Payer: BC Managed Care – PPO | Source: Ambulatory Visit | Attending: Physician Assistant

## 2022-12-17 ENCOUNTER — Ambulatory Visit: Payer: BC Managed Care – PPO | Admitting: Physician Assistant

## 2022-12-17 ENCOUNTER — Other Ambulatory Visit: Payer: Self-pay

## 2022-12-17 VITALS — BP 142/87 | HR 72 | Ht 71.0 in | Wt 225.0 lb

## 2022-12-17 DIAGNOSIS — N201 Calculus of ureter: Secondary | ICD-10-CM

## 2022-12-17 DIAGNOSIS — N200 Calculus of kidney: Secondary | ICD-10-CM | POA: Insufficient documentation

## 2022-12-17 LAB — MICROSCOPIC EXAMINATION: RBC, Urine: >30 /HPF — AB (ref 0–2)

## 2022-12-17 LAB — URINALYSIS, COMPLETE
Bilirubin, UA: NEGATIVE
Glucose, UA: NEGATIVE
Ketones, UA: NEGATIVE
Leukocytes,UA: NEGATIVE
Nitrite, UA: NEGATIVE
Specific Gravity, UA: >1.03 — ABNORMAL HIGH (ref 1.005–1.030)
Urobilinogen, Ur: 0.2 mg/dL (ref 0.2–1.0)
pH, UA: 6 (ref 5.0–7.5)

## 2022-12-17 NOTE — Progress Notes (Signed)
ESWL ORDER FORM  Expected date of procedure: 12/18/2022  Surgeon: [name], MD  Post op standing: 2-4wk follow up w/KUB prior  Anticoagulation/Aspirin/NSAID standing order: Hold all 72 hours prior  Anesthesia standing order: MAC  VTE standing: SCD's  Dx: Right Ureteral Stone  Procedure: right Extracorporeal shock wave lithotripsy  CPT : 50590  Standing Order Set:   *NPO after mn, KUB  *NS 112ml/hr, Keflex 500mg  PO, Benadryl 25mg  PO, Valium 10mg  PO, Zofran 4mg  IV    Medications if other than standing orders:   NONE

## 2022-12-17 NOTE — H&P (View-Only) (Signed)
12/17/2022 1:15 PM   Bradley Lynch 1965/12/06 [id number]  CC: Chief Complaint  Patient presents with   Nephrolithiasis   HPI: Bradley Lynch is a 57 y.o. male with PMH nephrolithiasis s/p multiple ESWLs and remote URS, ED on tadalafil, and hypogonadism who presents today for evaluation of a possible acute stone episode.   Today he reports an approximate 1 month history of progressive right flank pain, which has become more severe over the past week.  He is rather comfortable today in clinic.  He noticed gross hematuria last week but denies fever, chills, nausea, or vomiting.    He stopped his baby aspirin 5 days ago anticipating possible ESWL.  He had ureteroscopy in the remote past and did not tolerate the stent well.  KUB today with an apparent 4 mm right UPJ stone and a 3 mm left lower pole stone.  In-office UA today positive for 3+ blood and 1+ protein; urine microscopy with >30 RBCs/HPF.  PMH: Past Medical History:  Diagnosis Date   Hyperlipidemia    Hypertension     Surgical History: Past Surgical History:  Procedure Laterality Date   BACK SURGERY     EXTRACORPOREAL SHOCK WAVE LITHOTRIPSY Left 07/28/2019   Procedure: EXTRACORPOREAL SHOCK WAVE LITHOTRIPSY (ESWL);  Surgeon: Orson Ape, MD;  Location: [facility] ORS;  Service: Urology;  Laterality: Left;   LITHOTRIPSY      Home Medications:  Allergies as of 12/17/2022       Reactions   Iodinated Contrast Media    Other Reaction(s): Unknown        Medication List        Accurate as of December 17, 2022  1:15 PM. If you have any questions, ask your nurse or doctor.          aspirin EC 81 MG tablet Take by mouth.   cetirizine 10 MG chewable tablet Commonly known as: ZYRTEC Chew by mouth.   Cholecalciferol 125 MCG (5000 UT) Tabs Take by mouth.   cyanocobalamin 1000 MCG tablet Commonly known as: VITAMIN B12 Take 2 tablets daily for 2 weeks, then reduce to 1 tablet daily thereafter for Vitamin  B12 Deficiency.   felodipine 5 MG 24 hr tablet Commonly known as: PLENDIL Take by mouth.   FISH OIL PO Take by mouth.   fluticasone 50 MCG/ACT nasal spray Commonly known as: FLONASE Place into the nose.   lisinopril-hydrochlorothiazide 10-12.5 MG tablet Commonly known as: ZESTORETIC Take by mouth.   metFORMIN 500 MG 24 hr tablet Commonly known as: GLUCOPHAGE-XR Take 500 mg by mouth daily with breakfast.   montelukast 10 MG tablet Commonly known as: SINGULAIR Take 10 mg by mouth at bedtime.   simvastatin 40 MG tablet Commonly known as: ZOCOR Take by mouth.   tadalafil 20 MG tablet Commonly known as: CIALIS Take 1 tablet (20 mg total) by mouth every other day.        Allergies:  Allergies  Allergen Reactions   Iodinated Contrast Media     Other Reaction(s): Unknown    Family History: Family History  Problem Relation Age of Onset   Bladder Cancer Neg Hx    Prostate cancer Neg Hx    Kidney cancer Neg Hx     Social History:   reports that he has quit smoking. He has quit using smokeless tobacco. He reports current alcohol use. He reports that he does not use drugs.  Physical Exam: BP (!) 142/87   Pulse 72   Ht  5\' 11"  (1.803 m)   Wt 225 lb (102.1 kg)   BMI 31.38 kg/m   Constitutional:  Alert and oriented, no acute distress, nontoxic appearing HEENT: Platea, AT Cardiovascular: No clubbing, cyanosis, or edema Respiratory: Normal respiratory effort, no increased work of breathing Skin: No rashes, bruises or suspicious lesions Neurologic: Grossly intact, no focal deficits, moving all 4 extremities Psychiatric: Normal mood and affect  Laboratory Data: Results for orders placed or performed in visit on 12/17/22  Microscopic Examination   Urine  Result Value Ref Range   WBC, UA 0-5 0 - 5 /hpf   RBC, Urine >30 (A) 0 - 2 /hpf   Epithelial Cells (non renal) 0-10 0 - 10 /hpf   Mucus, UA Present (A) Not Estab.   Bacteria, UA Few None seen/Few  Urinalysis,  Complete  Result Value Ref Range   Specific Gravity, UA >1.030 (H) 1.005 - 1.030   pH, UA 6.0 5.0 - 7.5   Color, UA Amber (A) Yellow   Appearance Ur Cloudy (A) Clear   Leukocytes,UA Negative Negative   Protein,UA 1+ (A) Negative/Trace   Glucose, UA Negative Negative   Ketones, UA Negative Negative   RBC, UA 3+ (A) Negative   Bilirubin, UA Negative Negative   Urobilinogen, Ur 0.2 0.2 - 1.0 mg/dL   Nitrite, UA Negative Negative   Microscopic Examination See below:    Pertinent Imaging: KUB, 12/17/2022: See Epic  I personally reviewed the images referenced above and note a 4 mm right UPJ stone and 3 mm left lower pole stone.  Assessment & Plan:   1. Obstruction of right ureteropelvic junction (UPJ) due to stone 4 mm right UPJ stone on KUB today.  UA with micro heme consistent with acute stone episode, otherwise bland.  We discussed various treatment options for his stone including trial of passage vs. ESWL vs. ureteroscopy with laser lithotripsy and stent.  He is well versed in these and declines additional explanation.  He would like to proceed with ESWL, which is reasonable.  He declined Flomax, pain meds, and antiemetics.  I counseled him to remain off baby aspirin. - Urinalysis, Complete - CULTURE, URINE COMPREHENSIVE - Ambulatory Referral For Surgery Scheduling   Return in 1 day (on 12/18/2022) for right ESWL with Dr. Apolinar Junes.  Carman Ching, PA-C  [facility] [place] [address] [place], [place] [address] [phone]

## 2022-12-17 NOTE — Progress Notes (Signed)
12/17/2022 1:15 PM   Bradley Lynch 1965/12/06 [id number]  CC: Chief Complaint  Patient presents with   Nephrolithiasis   HPI: Bradley Lynch is a 57 y.o. male with PMH nephrolithiasis s/p multiple ESWLs and remote URS, ED on tadalafil, and hypogonadism who presents today for evaluation of a possible acute stone episode.   Today he reports an approximate 1 month history of progressive right flank pain, which has become more severe over the past week.  He is rather comfortable today in clinic.  He noticed gross hematuria last week but denies fever, chills, nausea, or vomiting.    He stopped his baby aspirin 5 days ago anticipating possible ESWL.  He had ureteroscopy in the remote past and did not tolerate the stent well.  KUB today with an apparent 4 mm right UPJ stone and a 3 mm left lower pole stone.  In-office UA today positive for 3+ blood and 1+ protein; urine microscopy with >30 RBCs/HPF.  PMH: Past Medical History:  Diagnosis Date   Hyperlipidemia    Hypertension     Surgical History: Past Surgical History:  Procedure Laterality Date   BACK SURGERY     EXTRACORPOREAL SHOCK WAVE LITHOTRIPSY Left 07/28/2019   Procedure: EXTRACORPOREAL SHOCK WAVE LITHOTRIPSY (ESWL);  Surgeon: Orson Ape, MD;  Location: [facility] ORS;  Service: Urology;  Laterality: Left;   LITHOTRIPSY      Home Medications:  Allergies as of 12/17/2022       Reactions   Iodinated Contrast Media    Other Reaction(s): Unknown        Medication List        Accurate as of December 17, 2022  1:15 PM. If you have any questions, ask your nurse or doctor.          aspirin EC 81 MG tablet Take by mouth.   cetirizine 10 MG chewable tablet Commonly known as: ZYRTEC Chew by mouth.   Cholecalciferol 125 MCG (5000 UT) Tabs Take by mouth.   cyanocobalamin 1000 MCG tablet Commonly known as: VITAMIN B12 Take 2 tablets daily for 2 weeks, then reduce to 1 tablet daily thereafter for Vitamin  B12 Deficiency.   felodipine 5 MG 24 hr tablet Commonly known as: PLENDIL Take by mouth.   FISH OIL PO Take by mouth.   fluticasone 50 MCG/ACT nasal spray Commonly known as: FLONASE Place into the nose.   lisinopril-hydrochlorothiazide 10-12.5 MG tablet Commonly known as: ZESTORETIC Take by mouth.   metFORMIN 500 MG 24 hr tablet Commonly known as: GLUCOPHAGE-XR Take 500 mg by mouth daily with breakfast.   montelukast 10 MG tablet Commonly known as: SINGULAIR Take 10 mg by mouth at bedtime.   simvastatin 40 MG tablet Commonly known as: ZOCOR Take by mouth.   tadalafil 20 MG tablet Commonly known as: CIALIS Take 1 tablet (20 mg total) by mouth every other day.        Allergies:  Allergies  Allergen Reactions   Iodinated Contrast Media     Other Reaction(s): Unknown    Family History: Family History  Problem Relation Age of Onset   Bladder Cancer Neg Hx    Prostate cancer Neg Hx    Kidney cancer Neg Hx     Social History:   reports that he has quit smoking. He has quit using smokeless tobacco. He reports current alcohol use. He reports that he does not use drugs.  Physical Exam: BP (!) 142/87   Pulse 72   Ht  5\' 11"  (1.803 m)   Wt 225 lb (102.1 kg)   BMI 31.38 kg/m   Constitutional:  Alert and oriented, no acute distress, nontoxic appearing HEENT: Platea, AT Cardiovascular: No clubbing, cyanosis, or edema Respiratory: Normal respiratory effort, no increased work of breathing Skin: No rashes, bruises or suspicious lesions Neurologic: Grossly intact, no focal deficits, moving all 4 extremities Psychiatric: Normal mood and affect  Laboratory Data: Results for orders placed or performed in visit on 12/17/22  Microscopic Examination   Urine  Result Value Ref Range   WBC, UA 0-5 0 - 5 /hpf   RBC, Urine >30 (A) 0 - 2 /hpf   Epithelial Cells (non renal) 0-10 0 - 10 /hpf   Mucus, UA Present (A) Not Estab.   Bacteria, UA Few None seen/Few  Urinalysis,  Complete  Result Value Ref Range   Specific Gravity, UA >1.030 (H) 1.005 - 1.030   pH, UA 6.0 5.0 - 7.5   Color, UA Amber (A) Yellow   Appearance Ur Cloudy (A) Clear   Leukocytes,UA Negative Negative   Protein,UA 1+ (A) Negative/Trace   Glucose, UA Negative Negative   Ketones, UA Negative Negative   RBC, UA 3+ (A) Negative   Bilirubin, UA Negative Negative   Urobilinogen, Ur 0.2 0.2 - 1.0 mg/dL   Nitrite, UA Negative Negative   Microscopic Examination See below:    Pertinent Imaging: KUB, 12/17/2022: See Epic  I personally reviewed the images referenced above and note a 4 mm right UPJ stone and 3 mm left lower pole stone.  Assessment & Plan:   1. Obstruction of right ureteropelvic junction (UPJ) due to stone 4 mm right UPJ stone on KUB today.  UA with micro heme consistent with acute stone episode, otherwise bland.  We discussed various treatment options for his stone including trial of passage vs. ESWL vs. ureteroscopy with laser lithotripsy and stent.  He is well versed in these and declines additional explanation.  He would like to proceed with ESWL, which is reasonable.  He declined Flomax, pain meds, and antiemetics.  I counseled him to remain off baby aspirin. - Urinalysis, Complete - CULTURE, URINE COMPREHENSIVE - Ambulatory Referral For Surgery Scheduling   Return in 1 day (on 12/18/2022) for right ESWL with Dr. Apolinar Junes.  Carman Ching, PA-C  [facility] [place] [address] [place], [place] [address] [phone]

## 2022-12-18 ENCOUNTER — Emergency Department: Payer: BC Managed Care – PPO

## 2022-12-18 ENCOUNTER — Ambulatory Visit
Admission: RE | Admit: 2022-12-18 | Discharge: 2022-12-18 | Disposition: A | Discharge status: Home / Self Care | Payer: BC Managed Care – PPO | Source: Ambulatory Visit | Attending: Urology | Admitting: Urology

## 2022-12-18 ENCOUNTER — Other Ambulatory Visit: Payer: Self-pay

## 2022-12-18 ENCOUNTER — Telehealth: Payer: Self-pay | Admitting: *Deleted

## 2022-12-18 ENCOUNTER — Encounter
Admission: RE | Disposition: A | Discharge status: Home / Self Care | Payer: Self-pay | Source: Ambulatory Visit | Attending: Urology

## 2022-12-18 ENCOUNTER — Inpatient Hospital Stay
Admission: EM | Admit: 2022-12-18 | Discharge: 2022-12-23 | DRG: 920 | Disposition: A | Discharge status: Home / Self Care | Payer: BC Managed Care – PPO | Attending: Internal Medicine | Admitting: Internal Medicine

## 2022-12-18 ENCOUNTER — Encounter: Payer: Self-pay | Admitting: Emergency Medicine

## 2022-12-18 ENCOUNTER — Ambulatory Visit: Payer: BC Managed Care – PPO

## 2022-12-18 DIAGNOSIS — N201 Calculus of ureter: Secondary | ICD-10-CM | POA: Diagnosis not present

## 2022-12-18 DIAGNOSIS — N200 Calculus of kidney: Secondary | ICD-10-CM | POA: Insufficient documentation

## 2022-12-18 DIAGNOSIS — I10 Essential (primary) hypertension: Secondary | ICD-10-CM | POA: Diagnosis present

## 2022-12-18 DIAGNOSIS — Z6831 Body mass index (BMI) 31.0-31.9, adult: Secondary | ICD-10-CM

## 2022-12-18 DIAGNOSIS — K5730 Diverticulosis of large intestine without perforation or abscess without bleeding: Secondary | ICD-10-CM | POA: Diagnosis present

## 2022-12-18 DIAGNOSIS — E119 Type 2 diabetes mellitus without complications: Secondary | ICD-10-CM | POA: Diagnosis present

## 2022-12-18 DIAGNOSIS — E669 Obesity, unspecified: Secondary | ICD-10-CM | POA: Diagnosis present

## 2022-12-18 DIAGNOSIS — T148XXA Other injury of unspecified body region, initial encounter: Secondary | ICD-10-CM | POA: Diagnosis present

## 2022-12-18 DIAGNOSIS — Z9889 Other specified postprocedural states: Secondary | ICD-10-CM

## 2022-12-18 DIAGNOSIS — Y838 Other surgical procedures as the cause of abnormal reaction of the patient, or of later complication, without mention of misadventure at the time of the procedure: Secondary | ICD-10-CM | POA: Diagnosis present

## 2022-12-18 DIAGNOSIS — I700 Atherosclerosis of aorta: Secondary | ICD-10-CM | POA: Diagnosis present

## 2022-12-18 DIAGNOSIS — K5900 Constipation, unspecified: Secondary | ICD-10-CM | POA: Diagnosis present

## 2022-12-18 DIAGNOSIS — E785 Hyperlipidemia, unspecified: Secondary | ICD-10-CM | POA: Diagnosis present

## 2022-12-18 DIAGNOSIS — Z7982 Long term (current) use of aspirin: Secondary | ICD-10-CM

## 2022-12-18 DIAGNOSIS — E538 Deficiency of other specified B group vitamins: Secondary | ICD-10-CM | POA: Diagnosis present

## 2022-12-18 DIAGNOSIS — Z87891 Personal history of nicotine dependence: Secondary | ICD-10-CM | POA: Insufficient documentation

## 2022-12-18 DIAGNOSIS — R310 Gross hematuria: Secondary | ICD-10-CM | POA: Insufficient documentation

## 2022-12-18 DIAGNOSIS — N179 Acute kidney failure, unspecified: Secondary | ICD-10-CM | POA: Diagnosis present

## 2022-12-18 DIAGNOSIS — R52 Pain, unspecified: Secondary | ICD-10-CM | POA: Diagnosis present

## 2022-12-18 DIAGNOSIS — N202 Calculus of kidney with calculus of ureter: Secondary | ICD-10-CM | POA: Diagnosis present

## 2022-12-18 DIAGNOSIS — N400 Enlarged prostate without lower urinary tract symptoms: Secondary | ICD-10-CM | POA: Diagnosis present

## 2022-12-18 DIAGNOSIS — S37019A Minor contusion of unspecified kidney, initial encounter: Principal | ICD-10-CM

## 2022-12-18 DIAGNOSIS — N135 Crossing vessel and stricture of ureter without hydronephrosis: Secondary | ICD-10-CM | POA: Diagnosis present

## 2022-12-18 DIAGNOSIS — G8918 Other acute postprocedural pain: Secondary | ICD-10-CM | POA: Diagnosis present

## 2022-12-18 DIAGNOSIS — K429 Umbilical hernia without obstruction or gangrene: Secondary | ICD-10-CM | POA: Diagnosis present

## 2022-12-18 DIAGNOSIS — Z981 Arthrodesis status: Secondary | ICD-10-CM

## 2022-12-18 DIAGNOSIS — N99840 Postprocedural hematoma of a genitourinary system organ or structure following a genitourinary system procedure: Secondary | ICD-10-CM | POA: Diagnosis not present

## 2022-12-18 DIAGNOSIS — Z79899 Other long term (current) drug therapy: Secondary | ICD-10-CM

## 2022-12-18 DIAGNOSIS — Z87442 Personal history of urinary calculi: Secondary | ICD-10-CM

## 2022-12-18 DIAGNOSIS — R109 Unspecified abdominal pain: Secondary | ICD-10-CM | POA: Insufficient documentation

## 2022-12-18 DIAGNOSIS — Z91041 Radiographic dye allergy status: Secondary | ICD-10-CM

## 2022-12-18 DIAGNOSIS — Z7984 Long term (current) use of oral hypoglycemic drugs: Secondary | ICD-10-CM | POA: Insufficient documentation

## 2022-12-18 HISTORY — DX: Type 2 diabetes mellitus without complications: E11.9

## 2022-12-18 HISTORY — DX: Benign prostatic hyperplasia without lower urinary tract symptoms: N40.0

## 2022-12-18 HISTORY — DX: Calculus of ureter: N20.1

## 2022-12-18 HISTORY — PX: EXTRACORPOREAL SHOCK WAVE LITHOTRIPSY: SHX1557

## 2022-12-18 LAB — CBC WITH DIFFERENTIAL/PLATELET
Abs Immature Granulocytes: 0.04 10*3/uL (ref 0.00–0.07)
Basophils Absolute: 0.1 10*3/uL (ref 0.0–0.1)
Basophils Relative: 0 %
Eosinophils Absolute: 0 10*3/uL (ref 0.0–0.5)
Eosinophils Relative: 0 %
HCT: 43.5 % (ref 39.0–52.0)
Hemoglobin: 14.5 g/dL (ref 13.0–17.0)
Immature Granulocytes: 0 %
Lymphocytes Relative: 7 %
Lymphs Abs: 0.9 10*3/uL (ref 0.7–4.0)
MCH: 30 pg (ref 26.0–34.0)
MCHC: 33.3 g/dL (ref 30.0–36.0)
MCV: 90.1 fL (ref 80.0–100.0)
Monocytes Absolute: 1 10*3/uL (ref 0.1–1.0)
Monocytes Relative: 8 %
Neutro Abs: 10 10*3/uL — ABNORMAL HIGH (ref 1.7–7.7)
Neutrophils Relative %: 85 %
Platelets: 271 10*3/uL (ref 150–400)
RBC: 4.83 MIL/uL (ref 4.22–5.81)
RDW: 13.6 % (ref 11.5–15.5)
WBC: 11.9 10*3/uL — ABNORMAL HIGH (ref 4.0–10.5)
nRBC: 0 % (ref 0.0–0.2)

## 2022-12-18 LAB — COMPREHENSIVE METABOLIC PANEL
ALT: 20 U/L (ref 0–44)
AST: 21 U/L (ref 15–41)
Albumin: 3.6 g/dL (ref 3.5–5.0)
Alkaline Phosphatase: 54 U/L (ref 38–126)
Anion gap: 11 (ref 5–15)
BUN: 22 mg/dL — ABNORMAL HIGH (ref 6–20)
CO2: 26 mmol/L (ref 22–32)
Calcium: 8.6 mg/dL — ABNORMAL LOW (ref 8.9–10.3)
Chloride: 98 mmol/L (ref 98–111)
Creatinine, Ser: 1.26 mg/dL — ABNORMAL HIGH (ref 0.61–1.24)
GFR, Estimated: >60 mL/min (ref >60)
Glucose, Bld: 190 mg/dL — ABNORMAL HIGH (ref 70–99)
Potassium: 3.7 mmol/L (ref 3.5–5.1)
Sodium: 135 mmol/L (ref 135–145)
Total Bilirubin: 1.1 mg/dL (ref 0.3–1.2)
Total Protein: 6.4 g/dL — ABNORMAL LOW (ref 6.5–8.1)

## 2022-12-18 SURGERY — LITHOTRIPSY, ESWL
Anesthesia: Moderate Sedation | Laterality: Right

## 2022-12-18 MED ORDER — INSULIN ASPART 100 UNIT/ML IJ SOLN: 0.0000 [IU] | Freq: Every day | INTRAMUSCULAR | Status: DC

## 2022-12-18 MED ORDER — ONDANSETRON HCL 4 MG/2ML IJ SOLN
4.0000 mg | Freq: Once | INTRAMUSCULAR | Status: AC
  Administered 2022-12-18: 4 mg via INTRAVENOUS

## 2022-12-18 MED ORDER — MORPHINE SULFATE (PF) 4 MG/ML IV SOLN
4.0000 mg | Freq: Once | INTRAVENOUS | Status: AC
  Administered 2022-12-18: 4 mg via INTRAVENOUS
  Filled 2022-12-18: qty 1

## 2022-12-18 MED ORDER — CEPHALEXIN 500 MG PO CAPS
500.0000 mg | ORAL_CAPSULE | Freq: Once | ORAL | Status: AC
  Administered 2022-12-18: 500 mg via ORAL

## 2022-12-18 MED ORDER — SODIUM CHLORIDE 0.9 % IV BOLUS
1000.0000 mL | Freq: Once | INTRAVENOUS | Status: AC
  Administered 2022-12-18: 1000 mL via INTRAVENOUS

## 2022-12-18 MED ORDER — CEPHALEXIN 500 MG PO CAPS
ORAL_CAPSULE | ORAL | Status: AC
  Filled 2022-12-18: qty 1

## 2022-12-18 MED ORDER — KETOROLAC TROMETHAMINE 15 MG/ML IJ SOLN
15.0000 mg | Freq: Once | INTRAMUSCULAR | Status: AC
  Administered 2022-12-18: 15 mg via INTRAVENOUS
  Filled 2022-12-18: qty 1

## 2022-12-18 MED ORDER — TAMSULOSIN HCL 0.4 MG PO CAPS: 0.4000 mg | ORAL_CAPSULE | Freq: Every day | ORAL | 0 refills | Status: DC

## 2022-12-18 MED ORDER — INSULIN ASPART 100 UNIT/ML IJ SOLN
0.0000 [IU] | Freq: Three times a day (TID) | INTRAMUSCULAR | Status: DC
  Filled 2022-12-18: qty 1

## 2022-12-18 MED ORDER — DIAZEPAM 5 MG PO TABS
10.0000 mg | ORAL_TABLET | ORAL | Status: AC
  Administered 2022-12-18: 10 mg via ORAL

## 2022-12-18 MED ORDER — DIPHENHYDRAMINE HCL 25 MG PO CAPS
ORAL_CAPSULE | ORAL | Status: AC
  Filled 2022-12-18: qty 1

## 2022-12-18 MED ORDER — ONDANSETRON HCL 4 MG/2ML IJ SOLN
4.0000 mg | Freq: Once | INTRAMUSCULAR | Status: AC
  Administered 2022-12-18: 4 mg via INTRAVENOUS
  Filled 2022-12-18: qty 2

## 2022-12-18 MED ORDER — SODIUM CHLORIDE 0.9 % IV SOLN: INTRAVENOUS | Status: DC

## 2022-12-18 MED ORDER — DIPHENHYDRAMINE HCL 25 MG PO CAPS
25.0000 mg | ORAL_CAPSULE | ORAL | Status: AC
  Administered 2022-12-18: 25 mg via ORAL

## 2022-12-18 MED ORDER — DIAZEPAM 5 MG PO TABS
ORAL_TABLET | ORAL | Status: AC
  Filled 2022-12-18: qty 2

## 2022-12-18 MED ORDER — HYDRALAZINE HCL 20 MG/ML IJ SOLN: 5.0000 mg | INTRAMUSCULAR | Status: DC | PRN

## 2022-12-18 MED ORDER — MORPHINE SULFATE (PF) 4 MG/ML IV SOLN
8.0000 mg | Freq: Once | INTRAVENOUS | Status: AC
  Administered 2022-12-18: 8 mg via INTRAVENOUS
  Filled 2022-12-18: qty 2

## 2022-12-18 MED ORDER — ONDANSETRON HCL 4 MG/2ML IJ SOLN
INTRAMUSCULAR | Status: AC
  Filled 2022-12-18: qty 2

## 2022-12-18 MED ORDER — ACETAMINOPHEN 325 MG PO TABS
650.0000 mg | ORAL_TABLET | Freq: Four times a day (QID) | ORAL | Status: DC | PRN
  Administered 2022-12-20 – 2022-12-22 (×3): 650 mg via ORAL
  Filled 2022-12-18 (×4): qty 2

## 2022-12-18 MED ORDER — HYDROMORPHONE HCL 1 MG/ML IJ SOLN
1.0000 mg | INTRAMUSCULAR | Status: DC | PRN
  Administered 2022-12-19 – 2022-12-21 (×10): 1 mg via INTRAVENOUS
  Filled 2022-12-18 (×10): qty 1

## 2022-12-18 MED ORDER — ONDANSETRON HCL 4 MG/2ML IJ SOLN
4.0000 mg | Freq: Three times a day (TID) | INTRAMUSCULAR | Status: DC | PRN
  Administered 2022-12-19 – 2022-12-21 (×3): 4 mg via INTRAVENOUS
  Filled 2022-12-18 (×3): qty 2

## 2022-12-18 MED ORDER — OXYCODONE-ACETAMINOPHEN 5-325 MG PO TABS: 1.0000 | ORAL_TABLET | ORAL | Status: DC | PRN

## 2022-12-18 MED ORDER — SODIUM CHLORIDE 0.9 % IV SOLN
6.0000 mg | Freq: Once | INTRAVENOUS | Status: DC
  Filled 2022-12-18: qty 3

## 2022-12-18 NOTE — H&P (Signed)
History and Physical    Bradley Lynch ZOX:[id number] DOB: 01-15-1966 DOA: 12/18/2022  Referring MD/NP/PA:   PCP: Luciana Axe, NP   Patient coming from:  The patient is coming from home.     Chief Complaint: right flank pain  HPI: Bradley Lynch is a 57 y.o. male with medical history significant of hypertension, hyperlipidemia, diabetes mellitus, BPH, obesity, right UPJ obstructive stone, s/p of lithotripsy, who presents with right flank pain.  Due to right UPJ obstructive kidney stone, patient underwent ESWL lithotripsy by Dr. Apolinar Junes of urology today.  After procedure, patient developed right flank pain, which is constant, sharp, severe, nonradiating, associated with hematuria.  No dysuria or burning on urination.  No fever or chills.  Not aggravated or alleviated by any known factors.  No chest pain, cough, shortness of breath.  No nausea, vomiting, diarrhea or abdominal pain.  Data reviewed independently and ED Course: pt was found to have WBC 11.9, GFR> 60, temperature normal, blood pressure 142/87, heart rate 53, 107, RR 16, oxygen saturation 99% on room air.  Patient is placed on MedSurg bed of observation.  Dr. Apolinar Junes of urology was consulted by EDP.  CT-renal stone: 1. Large right perinephric fluid collection/hematoma measuring up to 4.8 cm in thickness. There is additional fluid, stranding and hyperdense fluid in the right perirenal and pararenal space minimally extending along the right retroperitoneum into the pelvis. Given the size and distribution of this subcapsular hematoma, patient is at risk for Page kidney. Recommend clinical correlation and follow-up. 2. Nonobstructing bilateral renal calculi with linear calcifications in the right renal pelvis and renal collecting system. 3. Sigmoid colon diverticulosis. 4. Small fat containing umbilical hernia.   Aortic Atherosclerosis (ICD10-I70.0).     EKG:   Not done in ED, will get one.     Review of Systems:    General: no fevers, chills, no body weight gain,  fatigue HEENT: no blurry vision, hearing changes or sore throat Respiratory: no dyspnea, coughing, wheezing CV: no chest pain, no palpitations GI: no nausea, vomiting, abdominal pain, diarrhea, constipation GU: no dysuria, burning on urination, increased urinary frequency, has hematuria  Ext: no leg edema Neuro: no unilateral weakness, numbness, or tingling, no vision change or hearing loss Skin: no rash, no skin tear. MSK: No muscle spasm, no deformity, no limitation of range of movement in spin. Has right flank pain Heme: No easy bruising.  Travel history: No recent long distant travel.   Allergy:  Allergies  Allergen Reactions   Iodinated Contrast Media     Other Reaction(s): Unknown    Past Medical History:  Diagnosis Date   BPH (benign prostatic hyperplasia)    Diabetes mellitus without complication (HCC)    Hyperlipidemia    Hypertension    Obstruction of right ureteropelvic junction (UPJ) due to stone     Past Surgical History:  Procedure Laterality Date   BACK SURGERY     EXTRACORPOREAL SHOCK WAVE LITHOTRIPSY Left 07/28/2019   Procedure: EXTRACORPOREAL SHOCK WAVE LITHOTRIPSY (ESWL);  Surgeon: Orson Ape, MD;  Location: [facility] ORS;  Service: Urology;  Laterality: Left;   LITHOTRIPSY      Social History:  reports that he has quit smoking. He has quit using smokeless tobacco. He reports current alcohol use. He reports that he does not use drugs.  Family History:  Family History  Problem Relation Age of Onset   Bladder Cancer Neg Hx    Prostate cancer Neg Hx    Kidney cancer  Neg Hx      Prior to Admission medications   Medication Sig Start Date End Date Taking? Authorizing Provider  aspirin EC 81 MG tablet Take by mouth.    [provider]  cetirizine (ZYRTEC) 10 MG chewable tablet Chew by mouth.    [provider]  Cholecalciferol 125 MCG (5000 UT) TABS Take by mouth. 03/05/21    [provider]  cyanocobalamin (VITAMIN B12) 1000 MCG tablet Take 2 tablets daily for 2 weeks, then reduce to 1 tablet daily thereafter for Vitamin B12 Deficiency. 03/05/21   [provider]  felodipine (PLENDIL) 5 MG 24 hr tablet Take by mouth. 05/22/16   [provider]  fluticasone (FLONASE) 50 MCG/ACT nasal spray Place into the nose. 10/09/14   [provider]  lisinopril-hydrochlorothiazide (PRINZIDE,ZESTORETIC) 10-12.5 MG tablet Take by mouth. 05/22/16 12/17/22  [provider]  metFORMIN (GLUCOPHAGE-XR) 500 MG 24 hr tablet Take 500 mg by mouth daily with breakfast. 04/07/22   [provider]  montelukast (SINGULAIR) 10 MG tablet Take 10 mg by mouth at bedtime. 03/12/22 03/12/23  [provider]  Omega-3 Fatty Acids (FISH OIL PO) Take by mouth.    [provider]  simvastatin (ZOCOR) 40 MG tablet Take by mouth. 11/15/15   [provider]  tadalafil (CIALIS) 20 MG tablet Take 1 tablet (20 mg total) by mouth every other day. 04/15/22   Sondra Come, MD  tamsulosin (FLOMAX) 0.4 MG CAPS capsule Take 1 capsule (0.4 mg total) by mouth daily. 12/18/22   [name], MD    Physical Exam: Vitals:   12/18/22 1952 12/18/22 1953 12/18/22 2322  BP: 137/86  109/65  Pulse: (!) 107  (!) 59  Resp: 16  18  Temp: 97.8 F (36.6 C)  98 F (36.7 C)  TempSrc: Oral  Oral  SpO2: 99%  96%  Weight:  102.1 kg   Height:  5\' 11"  (1.803 m)    General: Not in acute distress HEENT:       Eyes: PERRL, EOMI, no jaundice       ENT: No discharge from the ears and nose, no pharynx injection, no tonsillar enlargement.        Neck: No JVD, no bruit, no mass felt. Heme: No neck lymph node enlargement. Cardiac: S1/S2, RRR, No murmurs, No gallops or rubs. Respiratory: No rales, wheezing, rhonchi or rubs. GI: Soft, nondistended, nontender, no rebound pain, no organomegaly, BS present. GU: No hematuria. Has right CVA tenderness Ext: No  pitting leg edema bilaterally. 1+DP/PT pulse bilaterally. Musculoskeletal: No joint deformities, No joint redness or warmth, no limitation of ROM in spin. Skin: No rashes.  Neuro: Alert, oriented X3, cranial nerves II-XII grossly intact, moves all extremities normally.  Psych: Patient is not psychotic, no suicidal or hemocidal ideation.  Labs on Admission: I have personally reviewed following labs and imaging studies  CBC: Recent Labs  Lab 12/18/22 2038  WBC 11.9*  NEUTROABS 10.0*  HGB 14.5  HCT 43.5  MCV 90.1  PLT 271   Basic Metabolic Panel: Recent Labs  Lab 12/18/22 2038  NA 135  K 3.7  CL 98  CO2 26  GLUCOSE 190*  BUN 22*  CREATININE 1.26*  CALCIUM 8.6*   GFR: Estimated Creatinine Clearance: 77.7 mL/min (A) (by C-G formula based on SCr of 1.26 mg/dL (H)). Liver Function Tests: Recent Labs  Lab 12/18/22 2038  AST 21  ALT 20  ALKPHOS 54  BILITOT 1.1  PROT 6.4*  ALBUMIN  3.6   No results for input(s): "LIPASE", "AMYLASE" in the last 168 hours. No results for input(s): "AMMONIA" in the last 168 hours. Coagulation Profile: No results for input(s): "INR", "PROTIME" in the last 168 hours. Cardiac Enzymes: No results for input(s): "CKTOTAL", "CKMB", "CKMBINDEX", "TROPONINI" in the last 168 hours. BNP (last 3 results) No results for input(s): "PROBNP" in the last 8760 hours. HbA1C: No results for input(s): "HGBA1C" in the last 72 hours. CBG: Recent Labs  Lab 12/19/22 0007  GLUCAP 159*   Lipid Profile: No results for input(s): "CHOL", "HDL", "LDLCALC", "TRIG", "CHOLHDL", "LDLDIRECT" in the last 72 hours. Thyroid Function Tests: No results for input(s): "TSH", "T4TOTAL", "FREET4", "T3FREE", "THYROIDAB" in the last 72 hours. Anemia Panel: No results for input(s): "VITAMINB12", "FOLATE", "FERRITIN", "TIBC", "IRON", "RETICCTPCT" in the last 72 hours. Urine analysis:    Component Value Date/Time   APPEARANCEUR Cloudy (A) 12/17/2022 1313   GLUCOSEU Negative  12/17/2022 1313   BILIRUBINUR Negative 12/17/2022 1313   PROTEINUR 1+ (A) 12/17/2022 1313   NITRITE Negative 12/17/2022 1313   LEUKOCYTESUR Negative 12/17/2022 1313   Sepsis Labs: @LABRCNTIP (procalcitonin:4,lacticidven:4) ) Recent Results (from the past 240 hour(s))  Microscopic Examination     Status: Abnormal   Collection Time: 12/17/22  1:13 PM   Urine  Result Value Ref Range Status   WBC, UA 0-5 0 - 5 /hpf Final   RBC, Urine >30 (A) 0 - 2 /hpf Final   Epithelial Cells (non renal) 0-10 0 - 10 /hpf Final   Mucus, UA Present (A) Not Estab. Final   Bacteria, UA Few None seen/Few Final     Radiological Exams on Admission: CT Renal Stone Study  Result Date: 12/18/2022 CLINICAL DATA:  Abdominal and flank pain EXAM: CT ABDOMEN AND PELVIS WITHOUT CONTRAST TECHNIQUE: Multidetector CT imaging of the abdomen and pelvis was performed following the standard protocol without IV contrast. RADIATION DOSE REDUCTION: This exam was performed according to the departmental dose-optimization program which includes automated exposure control, adjustment of the mA and/or kV according to patient size and/or use of iterative reconstruction technique. COMPARISON:  CT of the pelvis 09/14/2007 FINDINGS: Lower chest: No acute abnormality. Hepatobiliary: No focal liver abnormality is seen. No gallstones, gallbladder wall thickening, or biliary dilatation. Pancreas: Unremarkable. No pancreatic ductal dilatation or surrounding inflammatory changes. Spleen: Normal in size without focal abnormality. Adrenals/Urinary Tract: There are punctate nonobstructing bilateral renal calculi. Linear calcifications are seen in the right renal pelvis and renal collecting system. There is no hydronephrosis. No ureteral or bladder calculi are seen. There is heterogeneous perinephric high density subcapsular fluid collection surrounding the entire kidney measuring up to 4.8 cm in thickness. Renal hilum is spared. There is a additional  fluid, stranding and hyperdense fluid in the right perirenal and pararenal space minimally extending along the right retroperitoneum into the pelvis. The adrenal glands and bladder are within normal limits. There is no left-sided hydronephrosis or perinephric fluid collection. Stomach/Bowel: Stomach is within normal limits. Appendix appears normal. No evidence of bowel wall thickening, distention, or inflammatory changes. There is sigmoid colon diverticulosis. Vascular/Lymphatic: Aortic atherosclerosis. No enlarged abdominal or pelvic lymph nodes. Reproductive: Prostate is unremarkable. Other: There is a small fat containing umbilical hernia. There is no ascites. Musculoskeletal: Posterior fusion hardware is seen at L5-S1 and L4. There is chronic appearing anterolisthesis at L5-S1. IMPRESSION: 1. Large right perinephric fluid collection/hematoma measuring up to 4.8 cm in thickness. There is additional fluid, stranding and hyperdense fluid in the right perirenal and pararenal  space minimally extending along the right retroperitoneum into the pelvis. Given the size and distribution of this subcapsular hematoma, patient is at risk for Page kidney. Recommend clinical correlation and follow-up. 2. Nonobstructing bilateral renal calculi with linear calcifications in the right renal pelvis and renal collecting system. 3. Sigmoid colon diverticulosis. 4. Small fat containing umbilical hernia. Aortic Atherosclerosis (ICD10-I70.0). Electronically Signed   By: Darliss Cheney M.D.   On: 12/18/2022 21:38   DG Abd 1 View  Result Date: 12/18/2022 CLINICAL DATA:  [id number] Right ureteral stone 130865. EXAM: ABDOMEN - 1 VIEW COMPARISON:  Abdominal radiograph 12/17/2022. FINDINGS: Calculi project over the medial aspect of the right renal shadow and lower aspect of the left renal shadow. Prior L5-S1 posterior spinal fusion. Normal bowel-gas pattern. IMPRESSION: Bilateral nephrolithiasis. Electronically Signed   By: Orvan Falconer  M.D.   On: 12/18/2022 13:00      Assessment/Plan Principal Problem:   Hematoma_right renal subscapular hematoma Active Problems:   Obstruction of right ureteropelvic junction (UPJ) due to stone   Hypertension   Hyperlipidemia   Diabetes mellitus without complication (HCC)   BPH (benign prostatic hyperplasia)   Obesity (BMI 30-39.9)   Assessment and Plan:   Hematoma_right renal subscapular hematoma and obstruction of right ureteropelvic junction (UPJ) due to stone: CT scan showed subcapsular hematoma. Hgb stable 14.5. consulted Dr. Apolinar Junes of urology by EDP  -Placed on MedSurg bed for patient (initially I will admitted the patient as inpatient, but changed to observation since patient does not need procedure per urologist) -Pain control: As needed Dilaudid, Percocet, Tylenol -Check INR/PTT/type screen -Follow-up hemoglobin level by CBC every 8 hour -Check urinalysis -Flomax  Hypertension -IV hydralazine as needed -Prinzide, felodipine  Hyperlipidemia -Zocor  Diabetes mellitus without complication (HCC): Recent A1c 6.7, well-controlled.  Patient taking metformin -Scale insulin  Obesity (BMI 30-39.9): Body weight 102.1 kg, BMI 31.39 -Encouraged to lose weight -Exercise and healthy diet    DVT ppx: SCD  Code Status: Full code     Family Communication: not done, no family member is at bed side.    Disposition Plan:  Anticipate discharge back to previous environment  Consults called:  Dr. Apolinar Junes of urology was consulted by EDP.   Admission status and Level of care: Med-Surg:   for obs    Dispo: The patient is from: Home              Anticipated d/c is to: Home              Anticipated d/c date is: 1 day              Patient currently is not medically stable to d/c.    Severity of Illness:  The appropriate patient status for this patient is OBSERVATION. Observation status is judged to be reasonable and necessary in order to provide the required intensity  of service to ensure the patient's safety. The patient's presenting symptoms, physical exam findings, and initial radiographic and laboratory data in the context of their medical condition is felt to place them at decreased risk for further clinical deterioration. Furthermore, it is anticipated that the patient will be medically stable for discharge from the hospital within 2 midnights of admission.        Date of Service 12/19/2022    Lorretta Harp [facility]   If 7PM-7AM, please contact night-coverage [email or web address] 12/19/2022, 1:22 AM

## 2022-12-18 NOTE — Telephone Encounter (Signed)
Wife calling stating that pt is in extreme pain after lithotripsy this morning. Per wife pt took a dilaudid from a old prescription and this isn't helping. I advised to drink plenty of water to help pass any stone fragments. Blood in urine: It's normal to notice blood in your urine for a few days or longer after the procedure.  Burning when you urinate: You may have a burning feeling when you urinate for several hours after the procedure. This feeling should go away within a day  Any other advice? Wife states pt can not take hydrocodone.

## 2022-12-18 NOTE — ED Provider Notes (Signed)
[facility] Provider Note    Event Date/Time   First MD Initiated Contact with Patient 12/18/22 2016     (approximate)   History   Post-op Problem   HPI  Bradley Lynch is a 57 y.o. male   Past medical history of pretension hyperlipidemia and kidney stone status post lithotripsy earlier today who presents with right flank pain.  This was the area of his kidney stone pain.  After the surgery he was sedated and did not feel much pain but upon getting home he realized that he had excruciating right flank pain with radiation to the right lower quadrant.  He has hematuria.  He denies fever or chills.  No injuries in the interim.  Independent Historian contributed to assessment above: Wife at bedside corroborates information given above  External Medical Documents Reviewed: Urology note with lithotripsy performed earlier today.      Physical Exam   Triage Vital Signs: ED Triage Vitals  Encounter Vitals Group     BP 12/18/22 1952 137/86     Systolic BP Percentile --      Diastolic BP Percentile --      Pulse Rate 12/18/22 1952 (!) 107     Resp 12/18/22 1952 16     Temp 12/18/22 1952 97.8 F (36.6 C)     Temp Source 12/18/22 1952 Oral     SpO2 12/18/22 1952 99 %     Weight 12/18/22 1953 225 lb 1.4 oz (102.1 kg)     Height 12/18/22 1953 5\' 11"  (1.803 m)     Head Circumference --      Peak Flow --      Pain Score 12/18/22 1953 10     Pain Loc --      Pain Education --      Exclude from Growth Chart --     Most recent vital signs: Vitals:   12/18/22 1952  BP: 137/86  Pulse: (!) 107  Resp: 16  Temp: 97.8 F (36.6 C)  SpO2: 99%    General: Awake, appears to be in pain holding his right flank. CV:  Good peripheral perfusion.  Resp:  Normal effort.  Abd:  No distention.  Other:  Tachycardic with right CVA tenderness.  Soft nontender abdomen no rigidity or guarding.   ED Results / Procedures / Treatments   Labs (all labs ordered are  listed, but only abnormal results are displayed) Labs Reviewed  COMPREHENSIVE METABOLIC PANEL - Abnormal; Notable for the following components:      Result Value   Glucose, Bld 190 (*)    BUN 22 (*)    Creatinine, Ser 1.26 (*)    Calcium 8.6 (*)    Total Protein 6.4 (*)    All other components within normal limits  CBC WITH DIFFERENTIAL/PLATELET - Abnormal; Notable for the following components:   WBC 11.9 (*)    Neutro Abs 10.0 (*)    All other components within normal limits  URINALYSIS, W/ REFLEX TO CULTURE (INFECTION SUSPECTED)     I ordered and reviewed the above labs they are notable for his white blood cell count mildly elevated 11.9.  H&H is normal.  RADIOLOGY I independently reviewed and interpreted CT of the abdomen pelvis and see some abnormalities surrounding the right kidney suspicious for subcapsular hematoma or inflammatory changes I also reviewed radiologist's formal read.   PROCEDURES:  Critical Care performed: No  Procedures   MEDICATIONS ORDERED IN ED: Medications  sodium chloride  0.9 % bolus 1,000 mL (0 mLs Intravenous Stopped 12/18/22 2156)  ondansetron (ZOFRAN) injection 4 mg (4 mg Intravenous Given 12/18/22 2037)  ketorolac (TORADOL) 15 MG/ML injection 15 mg (15 mg Intravenous Given 12/18/22 2037)  morphine (PF) 4 MG/ML injection 4 mg (4 mg Intravenous Given 12/18/22 2037)  ondansetron (ZOFRAN) injection 4 mg (4 mg Intravenous Given 12/18/22 2153)  morphine (PF) 4 MG/ML injection 8 mg (8 mg Intravenous Given 12/18/22 2154)    External physician / consultants:  I spoke with Dr Apolinar Junes of urology regarding care plan for this patient.   IMPRESSION / MDM / ASSESSMENT AND PLAN / ED COURSE  I reviewed the triage vital signs and the nursing notes.                                Patient's presentation is most consistent with acute presentation with potential threat to life or bodily function.  Differential diagnosis includes, but is not limited to, renal  colic, postoperative pain, pyelonephritis or urinary tract infection, hematoma   The patient is on the cardiac monitor to evaluate for evidence of arrhythmia and/or significant heart rate changes.  MDM:    Patient with postoperative pain after lithotripsy for right ureteral stone earlier today.  Tenderness to palpation, may be postoperative pain, renal colic, residual stone, obstructive uropathy, pyelonephritis, or postoperative bleeding or other complication.  Will give pain medications, check basic labs check urinalysis and CT scan abdomen pelvis without contrast given allergy.  -  Large perinephric hematoma seen on CT scan. I spoke with Dr. [name] of urology regarding this finding. Given his severe pain, requiring multiple doses of IV narcotic medications, will admit for pain control urology to consult in the morning.       FINAL CLINICAL IMPRESSION(S) / ED DIAGNOSES   Final diagnoses:  Post-operative pain  Right flank pain  Perinephric hematoma     Rx / DC Orders   ED Discharge Orders     None        Note:  This document was prepared using Dragon voice recognition software and may include unintentional dictation errors.    Pilar Jarvis, MD 12/18/22 2204

## 2022-12-18 NOTE — ED Notes (Signed)
ED Provider at bedside. 

## 2022-12-18 NOTE — Telephone Encounter (Signed)
800 mg motrin and alternate with tylenol  [name], MD

## 2022-12-18 NOTE — Discharge Instructions (Addendum)
See [facility] discharge instructions in chart.    AMBULATORY SURGERY  DISCHARGE INSTRUCTIONS   The drugs that you were given will stay in your system until tomorrow so for the next 24 hours you should not:  Drive an automobile Make any legal decisions Drink any alcoholic beverage   You may resume regular meals tomorrow.  Today it is better to start with liquids and gradually work up to solid foods.  You may eat anything you prefer, but it is better to start with liquids, then soup and crackers, and gradually work up to solid foods.   Please notify your doctor immediately if you have any unusual bleeding, trouble breathing, redness and pain at the surgery site, drainage, fever, or pain not relieved by medication.   Additional Instructions:  Please refer to documents given to you from [facility] Urological office  Please contact your physician with any problems or Same Day Surgery at [phone], Monday through Friday 6 am to 4 pm, or [place] at [facility] number at [phone].

## 2022-12-18 NOTE — Interval H&P Note (Signed)
History and Physical Interval Note:  12/18/2022 10:44 AM  Bradley Lynch  has presented today for surgery, with the diagnosis of Right Ureteral Stone.  The various methods of treatment have been discussed with the patient and family. After consideration of risks, benefits and other options for treatment, the patient has consented to  Procedure(s): EXTRACORPOREAL SHOCK WAVE LITHOTRIPSY (ESWL) (Right) as a surgical intervention.  The patient's history has been reviewed, patient examined, no change in status, stable for surgery.  I have reviewed the patient's chart and labs.  Questions were answered to the patient's satisfaction.    RRR CTAB  [name]

## 2022-12-18 NOTE — Telephone Encounter (Signed)
Spoke with patient's wife and advised results

## 2022-12-18 NOTE — Telephone Encounter (Signed)
pt and wife both on the phone with patient screaming in the background, stating that Dr. Artis Flock would give him meds to knock him out!!! pt has taken 2 dilaudid   Per Dr. Apolinar Junes:  That is the strongest medication available orally. did he take the flomax and motrin? he is in that much pain, he can go to the ED. I don't know what Dr. Artis Flock prescribed but it doesn't sound like standard of care    Yes pt took flomax and motrin.

## 2022-12-18 NOTE — ED Triage Notes (Signed)
Pt presents ambulatory to triage via POV with complaints of post-op pain on the R flank following a extracorporeal shock wave lithotripsy today. Pt notes taking some dilaudid around 1430 without any improvement in his sx. Endorses some hematuria following the procedure. A&Ox4 at this time. Denies CP or SOB.

## 2022-12-19 DIAGNOSIS — T148XXA Other injury of unspecified body region, initial encounter: Secondary | ICD-10-CM | POA: Diagnosis present

## 2022-12-19 DIAGNOSIS — R52 Pain, unspecified: Secondary | ICD-10-CM | POA: Diagnosis present

## 2022-12-19 DIAGNOSIS — I10 Essential (primary) hypertension: Secondary | ICD-10-CM | POA: Diagnosis present

## 2022-12-19 DIAGNOSIS — S37021A Major contusion of right kidney, initial encounter: Secondary | ICD-10-CM | POA: Diagnosis not present

## 2022-12-19 DIAGNOSIS — I700 Atherosclerosis of aorta: Secondary | ICD-10-CM | POA: Diagnosis present

## 2022-12-19 DIAGNOSIS — N400 Enlarged prostate without lower urinary tract symptoms: Secondary | ICD-10-CM | POA: Diagnosis present

## 2022-12-19 DIAGNOSIS — E119 Type 2 diabetes mellitus without complications: Secondary | ICD-10-CM | POA: Diagnosis present

## 2022-12-19 DIAGNOSIS — E538 Deficiency of other specified B group vitamins: Secondary | ICD-10-CM | POA: Diagnosis present

## 2022-12-19 DIAGNOSIS — Z79899 Other long term (current) drug therapy: Secondary | ICD-10-CM | POA: Diagnosis not present

## 2022-12-19 DIAGNOSIS — Z87442 Personal history of urinary calculi: Secondary | ICD-10-CM | POA: Diagnosis not present

## 2022-12-19 DIAGNOSIS — Z6831 Body mass index (BMI) 31.0-31.9, adult: Secondary | ICD-10-CM | POA: Diagnosis not present

## 2022-12-19 DIAGNOSIS — E669 Obesity, unspecified: Secondary | ICD-10-CM | POA: Diagnosis present

## 2022-12-19 DIAGNOSIS — N179 Acute kidney failure, unspecified: Secondary | ICD-10-CM | POA: Diagnosis present

## 2022-12-19 DIAGNOSIS — E785 Hyperlipidemia, unspecified: Secondary | ICD-10-CM | POA: Diagnosis present

## 2022-12-19 DIAGNOSIS — N135 Crossing vessel and stricture of ureter without hydronephrosis: Secondary | ICD-10-CM | POA: Diagnosis present

## 2022-12-19 DIAGNOSIS — N202 Calculus of kidney with calculus of ureter: Secondary | ICD-10-CM | POA: Diagnosis present

## 2022-12-19 DIAGNOSIS — G8918 Other acute postprocedural pain: Secondary | ICD-10-CM | POA: Diagnosis present

## 2022-12-19 DIAGNOSIS — N99840 Postprocedural hematoma of a genitourinary system organ or structure following a genitourinary system procedure: Secondary | ICD-10-CM | POA: Diagnosis present

## 2022-12-19 DIAGNOSIS — K5730 Diverticulosis of large intestine without perforation or abscess without bleeding: Secondary | ICD-10-CM | POA: Diagnosis present

## 2022-12-19 DIAGNOSIS — Z7982 Long term (current) use of aspirin: Secondary | ICD-10-CM | POA: Diagnosis not present

## 2022-12-19 DIAGNOSIS — Z9889 Other specified postprocedural states: Secondary | ICD-10-CM | POA: Diagnosis not present

## 2022-12-19 DIAGNOSIS — Z91041 Radiographic dye allergy status: Secondary | ICD-10-CM | POA: Diagnosis not present

## 2022-12-19 DIAGNOSIS — R310 Gross hematuria: Secondary | ICD-10-CM | POA: Diagnosis present

## 2022-12-19 DIAGNOSIS — K429 Umbilical hernia without obstruction or gangrene: Secondary | ICD-10-CM | POA: Diagnosis present

## 2022-12-19 DIAGNOSIS — Y838 Other surgical procedures as the cause of abnormal reaction of the patient, or of later complication, without mention of misadventure at the time of the procedure: Secondary | ICD-10-CM | POA: Diagnosis present

## 2022-12-19 DIAGNOSIS — Z87891 Personal history of nicotine dependence: Secondary | ICD-10-CM | POA: Diagnosis not present

## 2022-12-19 DIAGNOSIS — Z7984 Long term (current) use of oral hypoglycemic drugs: Secondary | ICD-10-CM | POA: Diagnosis not present

## 2022-12-19 DIAGNOSIS — K5900 Constipation, unspecified: Secondary | ICD-10-CM | POA: Diagnosis present

## 2022-12-19 LAB — CBC
HCT: 37.3 % — ABNORMAL LOW (ref 39.0–52.0)
HCT: 36 % — ABNORMAL LOW (ref 39.0–52.0)
HCT: 34.9 % — ABNORMAL LOW (ref 39.0–52.0)
Hemoglobin: 12.2 g/dL — ABNORMAL LOW (ref 13.0–17.0)
Hemoglobin: 12.2 g/dL — ABNORMAL LOW (ref 13.0–17.0)
Hemoglobin: 11.6 g/dL — ABNORMAL LOW (ref 13.0–17.0)
MCH: 30 pg (ref 26.0–34.0)
MCH: 30.4 pg (ref 26.0–34.0)
MCH: 30.4 pg (ref 26.0–34.0)
MCHC: 32.7 g/dL (ref 30.0–36.0)
MCHC: 33.9 g/dL (ref 30.0–36.0)
MCHC: 33.2 g/dL (ref 30.0–36.0)
MCV: 91.6 fL (ref 80.0–100.0)
MCV: 89.8 fL (ref 80.0–100.0)
MCV: 91.4 fL (ref 80.0–100.0)
Platelets: 247 10*3/uL (ref 150–400)
Platelets: 235 10*3/uL (ref 150–400)
Platelets: 226 10*3/uL (ref 150–400)
RBC: 4.07 MIL/uL — ABNORMAL LOW (ref 4.22–5.81)
RBC: 4.01 MIL/uL — ABNORMAL LOW (ref 4.22–5.81)
RBC: 3.82 MIL/uL — ABNORMAL LOW (ref 4.22–5.81)
RDW: 13.6 % (ref 11.5–15.5)
RDW: 13.9 % (ref 11.5–15.5)
RDW: 13.7 % (ref 11.5–15.5)
WBC: 10.9 10*3/uL — ABNORMAL HIGH (ref 4.0–10.5)
WBC: 10.9 10*3/uL — ABNORMAL HIGH (ref 4.0–10.5)
WBC: 9.6 10*3/uL (ref 4.0–10.5)
nRBC: 0 % (ref 0.0–0.2)
nRBC: 0 % (ref 0.0–0.2)
nRBC: 0 % (ref 0.0–0.2)

## 2022-12-19 LAB — PROTIME-INR
INR: 1 (ref 0.8–1.2)
Prothrombin Time: 13.8 seconds (ref 11.4–15.2)

## 2022-12-19 LAB — GLUCOSE, CAPILLARY
Glucose-Capillary: 148 mg/dL — ABNORMAL HIGH (ref 70–99)
Glucose-Capillary: 161 mg/dL — ABNORMAL HIGH (ref 70–99)

## 2022-12-19 LAB — BASIC METABOLIC PANEL
Anion gap: 10 (ref 5–15)
BUN: 28 mg/dL — ABNORMAL HIGH (ref 6–20)
CO2: 28 mmol/L (ref 22–32)
Calcium: 8 mg/dL — ABNORMAL LOW (ref 8.9–10.3)
Chloride: 98 mmol/L (ref 98–111)
Creatinine, Ser: 1.82 mg/dL — ABNORMAL HIGH (ref 0.61–1.24)
GFR, Estimated: 43 mL/min — ABNORMAL LOW (ref >60)
Glucose, Bld: 155 mg/dL — ABNORMAL HIGH (ref 70–99)
Potassium: 4 mmol/L (ref 3.5–5.1)
Sodium: 136 mmol/L (ref 135–145)

## 2022-12-19 LAB — TYPE AND SCREEN
ABO/RH(D): O POS
Antibody Screen: NEGATIVE

## 2022-12-19 LAB — CBG MONITORING, ED
Glucose-Capillary: 143 mg/dL — ABNORMAL HIGH (ref 70–99)
Glucose-Capillary: 149 mg/dL — ABNORMAL HIGH (ref 70–99)
Glucose-Capillary: 159 mg/dL — ABNORMAL HIGH (ref 70–99)

## 2022-12-19 LAB — HIV ANTIBODY (ROUTINE TESTING W REFLEX): HIV Screen 4th Generation wRfx: NONREACTIVE

## 2022-12-19 LAB — APTT: aPTT: 26 seconds (ref 24–36)

## 2022-12-19 MED ORDER — HYDROCHLOROTHIAZIDE 12.5 MG PO TABS: 12.5000 mg | ORAL_TABLET | Freq: Every day | ORAL | Status: DC

## 2022-12-19 MED ORDER — LISINOPRIL 10 MG PO TABS: 10.0000 mg | ORAL_TABLET | Freq: Every day | ORAL | Status: DC

## 2022-12-19 MED ORDER — SIMVASTATIN 20 MG PO TABS
40.0000 mg | ORAL_TABLET | Freq: Every day | ORAL | Status: DC
  Administered 2022-12-19 – 2022-12-22 (×4): 40 mg via ORAL
  Filled 2022-12-19 (×4): qty 2

## 2022-12-19 MED ORDER — LORATADINE 10 MG PO TABS
10.0000 mg | ORAL_TABLET | Freq: Every day | ORAL | Status: DC
  Administered 2022-12-19 – 2022-12-23 (×3): 10 mg via ORAL
  Filled 2022-12-19 (×3): qty 1

## 2022-12-19 MED ORDER — FELODIPINE ER 5 MG PO TB24
5.0000 mg | ORAL_TABLET | Freq: Every day | ORAL | Status: DC
  Administered 2022-12-19 – 2022-12-23 (×5): 5 mg via ORAL
  Filled 2022-12-19 (×5): qty 1

## 2022-12-19 MED ORDER — LACTATED RINGERS IV SOLN: INTRAVENOUS | Status: DC

## 2022-12-19 MED ORDER — TAMSULOSIN HCL 0.4 MG PO CAPS
0.4000 mg | ORAL_CAPSULE | Freq: Every day | ORAL | Status: DC
  Administered 2022-12-19 – 2022-12-23 (×5): 0.4 mg via ORAL
  Filled 2022-12-19 (×5): qty 1

## 2022-12-19 MED ORDER — LISINOPRIL-HYDROCHLOROTHIAZIDE 10-12.5 MG PO TABS: 1.0000 | ORAL_TABLET | Freq: Every day | ORAL | Status: DC

## 2022-12-19 MED ORDER — FLUTICASONE PROPIONATE 50 MCG/ACT NA SUSP
1.0000 | Freq: Every day | NASAL | Status: DC
  Administered 2022-12-19 – 2022-12-22 (×2): 1 via NASAL
  Filled 2022-12-19: qty 16

## 2022-12-19 MED ORDER — KETOROLAC TROMETHAMINE 15 MG/ML IJ SOLN
15.0000 mg | Freq: Four times a day (QID) | INTRAMUSCULAR | Status: AC
  Administered 2022-12-19 – 2022-12-20 (×5): 15 mg via INTRAVENOUS
  Filled 2022-12-19 (×5): qty 1

## 2022-12-19 MED ORDER — MONTELUKAST SODIUM 10 MG PO TABS
10.0000 mg | ORAL_TABLET | Freq: Every day | ORAL | Status: DC
  Administered 2022-12-19 – 2022-12-22 (×4): 10 mg via ORAL
  Filled 2022-12-19 (×4): qty 1

## 2022-12-19 NOTE — Progress Notes (Signed)
PROGRESS NOTE    Bradley Lynch  NGE:[id number] DOB: 03/05/1966 DOA: 12/18/2022 PCP: Luciana Axe, NP    Brief Narrative:   57 y.o. male with medical history significant of hypertension, hyperlipidemia, diabetes mellitus, BPH, obesity, right UPJ obstructive stone, s/p of lithotripsy, who presents with right flank pain.   Due to right UPJ obstructive kidney stone, patient underwent ESWL lithotripsy by Dr. Apolinar Junes of urology today.  After procedure, patient developed right flank pain, which is constant, sharp, severe, nonradiating, associated with hematuria.  No dysuria or burning on urination.  No fever or chills.  Not aggravated or alleviated by any known factors.  No chest pain, cough, shortness of breath.  No nausea, vomiting, diarrhea or abdominal pain.  Seen in consultation by urology.  Recommend intravenous fluids, pain control, antiemetics.   Assessment & Plan:   Principal Problem:   Hematoma_right renal subscapular hematoma Active Problems:   Obstruction of right ureteropelvic junction (UPJ) due to stone   Hypertension   Hyperlipidemia   Diabetes mellitus without complication (HCC)   BPH (benign prostatic hyperplasia)   Obesity (BMI 30-39.9)   Intractable pain  Hematoma_right renal subscapular hematoma and obstruction of right ureteropelvic junction (UPJ) due to stone: CT scan showed subcapsular hematoma.  Hemoglobin stable.  Urology consulted.  Patient admitted to medicine for treatment of intractable pain. Plan: Admit inpatient Multimodal pain control Flomax IVF Anticipate discharge 9/21  Intractable pain Secondary to subcapsular hematoma Multimodal pain control  AKI Lactated Ringer's 125 cc/h Recheck creatinine in a.m.   Hypertension -IV hydralazine as needed -Prinzide, felodipine   Hyperlipidemia -Zocor   Diabetes mellitus without complication [facility]): Recent A1c 6.7, well-controlled.  Patient taking metformin -Scale insulin   Obesity (BMI  30-39.9): Body weight 102.1 kg, BMI 31.39 -Encouraged to lose weight -Exercise and healthy diet       DVT prophylaxis: SCD Code Status: Full Family Communication: Spouse at bedside 9/20 Disposition Plan: Status is: Inpatient Remains inpatient appropriate because: Intractable pain   Level of care: Med-Surg  Consultants:  Urology  Procedures:  None  Antimicrobials: None   Subjective: Seen and examined in ED hallway.  Still in pain  Objective: Vitals:   12/18/22 1952 12/18/22 1953 12/18/22 2322 12/19/22 0345  BP: 137/86  109/65 118/66  Pulse: (!) 107  (!) 59 62  Resp: 16  18 16   Temp: 97.8 F (36.6 C)  98 F (36.7 C) 98.1 F (36.7 C)  TempSrc: Oral  Oral Oral  SpO2: 99%  96% 96%  Weight:  102.1 kg    Height:  5\' 11"  (1.803 m)      Intake/Output Summary (Last 24 hours) at 12/19/2022 1231 Last data filed at 12/18/2022 2156 Gross per 24 hour  Intake 1000 ml  Output --  Net 1000 ml   Filed Weights   12/18/22 1953  Weight: 102.1 kg    Examination:  General exam: Mild distress due to pain Respiratory system: Clear to auscultation. Respiratory effort normal. Cardiovascular system: Tachycardic, regular rhythm, no murmurs, no pedal edema Gastrointestinal system: Obese, soft, NT/ND, bowel sounds Central nervous system: Alert and oriented. No focal neurological deficits. Extremities: Symmetric 5 x 5 power. Skin: No rashes, lesions or ulcers Psychiatry: Judgement and insight appear normal. Mood & affect appropriate.     Data Reviewed: I have personally reviewed following labs and imaging studies  CBC: Recent Labs  Lab 12/18/22 2038 12/19/22 0546  WBC 11.9* 10.9*  NEUTROABS 10.0*  --   HGB 14.5 12.2*  HCT 43.5 37.3*  MCV 90.1 91.6  PLT 271 247   Basic Metabolic Panel: Recent Labs  Lab 12/18/22 2038 12/19/22 0546  NA 135 136  K 3.7 4.0  CL 98 98  CO2 26 28  GLUCOSE 190* 155*  BUN 22* 28*  CREATININE 1.26* 1.82*  CALCIUM 8.6* 8.0*    GFR: Estimated Creatinine Clearance: 53.8 mL/min (A) (by C-G formula based on SCr of 1.82 mg/dL (H)). Liver Function Tests: Recent Labs  Lab 12/18/22 2038  AST 21  ALT 20  ALKPHOS 54  BILITOT 1.1  PROT 6.4*  ALBUMIN 3.6   No results for input(s): "LIPASE", "AMYLASE" in the last 168 hours. No results for input(s): "AMMONIA" in the last 168 hours. Coagulation Profile: Recent Labs  Lab 12/19/22 0544  INR 1.0   Cardiac Enzymes: No results for input(s): "CKTOTAL", "CKMB", "CKMBINDEX", "TROPONINI" in the last 168 hours. BNP (last 3 results) No results for input(s): "PROBNP" in the last 8760 hours. HbA1C: No results for input(s): "HGBA1C" in the last 72 hours. CBG: Recent Labs  Lab 12/19/22 0007 12/19/22 0828 12/19/22 1143  GLUCAP 159* 143* 149*   Lipid Profile: No results for input(s): "CHOL", "HDL", "LDLCALC", "TRIG", "CHOLHDL", "LDLDIRECT" in the last 72 hours. Thyroid Function Tests: No results for input(s): "TSH", "T4TOTAL", "FREET4", "T3FREE", "THYROIDAB" in the last 72 hours. Anemia Panel: No results for input(s): "VITAMINB12", "FOLATE", "FERRITIN", "TIBC", "IRON", "RETICCTPCT" in the last 72 hours. Sepsis Labs: No results for input(s): "PROCALCITON", "LATICACIDVEN" in the last 168 hours.  Recent Results (from the past 240 hour(s))  Microscopic Examination     Status: Abnormal   Collection Time: 12/17/22  1:13 PM   Urine  Result Value Ref Range Status   WBC, UA 0-5 0 - 5 /hpf Final   RBC, Urine >30 (A) 0 - 2 /hpf Final   Epithelial Cells (non renal) 0-10 0 - 10 /hpf Final   Mucus, UA Present (A) Not Estab. Final   Bacteria, UA Few None seen/Few Final         Radiology Studies: CT Renal Stone Study  Result Date: 12/18/2022 CLINICAL DATA:  Abdominal and flank pain EXAM: CT ABDOMEN AND PELVIS WITHOUT CONTRAST TECHNIQUE: Multidetector CT imaging of the abdomen and pelvis was performed following the standard protocol without IV contrast. RADIATION DOSE  REDUCTION: This exam was performed according to the departmental dose-optimization program which includes automated exposure control, adjustment of the mA and/or kV according to patient size and/or use of iterative reconstruction technique. COMPARISON:  CT of the pelvis 09/14/2007 FINDINGS: Lower chest: No acute abnormality. Hepatobiliary: No focal liver abnormality is seen. No gallstones, gallbladder wall thickening, or biliary dilatation. Pancreas: Unremarkable. No pancreatic ductal dilatation or surrounding inflammatory changes. Spleen: Normal in size without focal abnormality. Adrenals/Urinary Tract: There are punctate nonobstructing bilateral renal calculi. Linear calcifications are seen in the right renal pelvis and renal collecting system. There is no hydronephrosis. No ureteral or bladder calculi are seen. There is heterogeneous perinephric high density subcapsular fluid collection surrounding the entire kidney measuring up to 4.8 cm in thickness. Renal hilum is spared. There is a additional fluid, stranding and hyperdense fluid in the right perirenal and pararenal space minimally extending along the right retroperitoneum into the pelvis. The adrenal glands and bladder are within normal limits. There is no left-sided hydronephrosis or perinephric fluid collection. Stomach/Bowel: Stomach is within normal limits. Appendix appears normal. No evidence of bowel wall thickening, distention, or inflammatory changes. There is sigmoid colon diverticulosis. Vascular/Lymphatic:  Aortic atherosclerosis. No enlarged abdominal or pelvic lymph nodes. Reproductive: Prostate is unremarkable. Other: There is a small fat containing umbilical hernia. There is no ascites. Musculoskeletal: Posterior fusion hardware is seen at L5-S1 and L4. There is chronic appearing anterolisthesis at L5-S1. IMPRESSION: 1. Large right perinephric fluid collection/hematoma measuring up to 4.8 cm in thickness. There is additional fluid, stranding  and hyperdense fluid in the right perirenal and pararenal space minimally extending along the right retroperitoneum into the pelvis. Given the size and distribution of this subcapsular hematoma, patient is at risk for Page kidney. Recommend clinical correlation and follow-up. 2. Nonobstructing bilateral renal calculi with linear calcifications in the right renal pelvis and renal collecting system. 3. Sigmoid colon diverticulosis. 4. Small fat containing umbilical hernia. Aortic Atherosclerosis (ICD10-I70.0). Electronically Signed   By: Darliss Cheney M.D.   On: 12/18/2022 21:38   DG Abd 1 View  Result Date: 12/18/2022 CLINICAL DATA:  [id number] Right ureteral stone [id number]. EXAM: ABDOMEN - 1 VIEW COMPARISON:  Abdominal radiograph 12/17/2022. FINDINGS: Calculi project over the medial aspect of the right renal shadow and lower aspect of the left renal shadow. Prior L5-S1 posterior spinal fusion. Normal bowel-gas pattern. IMPRESSION: Bilateral nephrolithiasis. Electronically Signed   By: Orvan Falconer M.D.   On: 12/18/2022 13:00        Scheduled Meds:  felodipine  5 mg Oral Daily   fluticasone  1 spray Each Nare Daily   insulin aspart  0-5 Units Subcutaneous QHS   insulin aspart  0-9 Units Subcutaneous TID WC   ketorolac  15 mg Intravenous Q6H   loratadine  10 mg Oral Daily   montelukast  10 mg Oral QHS   simvastatin  40 mg Oral q1800   tamsulosin  0.4 mg Oral Daily   Continuous Infusions:  lactated ringers 125 mL/hr at 12/19/22 1139     LOS: 1 day     Tresa Moore, MD [facility]   If 7PM-7AM, please contact night-coverage  12/19/2022, 12:31 PM

## 2022-12-19 NOTE — Consult Note (Signed)
Urology Consult  I have been asked to see the patient by Dr. Clyde Lundborg, for evaluation and management of right subcapsular hematoma.  Chief Complaint: Right flank pain, hematuria  History of Present Illness: Bradley Lynch is a 57 y.o. year old male who underwent right ESWL with Dr. Apolinar Junes for management of a 4 mm right UPJ stone.  He presented to the ED last night with reports of severe right flank pain following the procedure; CT stone study revealed a large right subcapsular hematoma measuring 4.8 cm in thickness as well as nonobstructing bilateral renal stones.  Hemoglobin 12.2 this morning, 14.5 on arrival.  Creatinine 1.82, 1.26 on arrival.  He has been normotensive, HR normal.  He is accompanied today by his wife at the bedside.  He has been having nausea and vomiting since his arrival overnight.  He is being admitted to medicine for pain control.  Past Medical History:  Diagnosis Date   BPH (benign prostatic hyperplasia)    Diabetes mellitus without complication (HCC)    Hyperlipidemia    Hypertension    Obstruction of right ureteropelvic junction (UPJ) due to stone     Past Surgical History:  Procedure Laterality Date   BACK SURGERY     EXTRACORPOREAL SHOCK WAVE LITHOTRIPSY Left 07/28/2019   Procedure: EXTRACORPOREAL SHOCK WAVE LITHOTRIPSY (ESWL);  Surgeon: Orson Ape, MD;  Location: [facility] ORS;  Service: Urology;  Laterality: Left;   LITHOTRIPSY      Home Medications:  No outpatient medications have been marked as taking for the 12/18/22 encounter [facility] Encounter).    Allergies:  Allergies  Allergen Reactions   Iodinated Contrast Media     Other Reaction(s): Unknown    Family History  Problem Relation Age of Onset   Bladder Cancer Neg Hx    Prostate cancer Neg Hx    Kidney cancer Neg Hx     Social History:  reports that he has quit smoking. He has quit using smokeless tobacco. He reports current alcohol use. He reports that he does not use  drugs.  ROS: A complete review of systems was performed.  All systems are negative except for pertinent findings as noted.  Physical Exam:  Vital signs in last 24 hours: Temp:  [97.8 F (36.6 C)-98.6 F (37 C)] 98.1 F (36.7 C) (09/20 0345) Pulse Rate:  [53-107] 62 (09/20 0345) Resp:  [16-18] 16 (09/20 0345) BP: (109-137)/(65-86) 118/66 (09/20 0345) SpO2:  [96 %-99 %] 96 % (09/20 0345) Weight:  [102.1 kg] 102.1 kg (09/19 1953) Constitutional:  Alert and oriented, uncomfortable appearing HEENT:  AT, moist mucus membranes Cardiovascular: No clubbing, cyanosis, or edema Respiratory: Normal respiratory effort Skin: No rashes, bruises or suspicious lesions Neurologic: Grossly intact, no focal deficits, moving all 4 extremities Psychiatric: Normal mood and affect  Laboratory Data:  Recent Labs    12/18/22 2038 12/19/22 0546  WBC 11.9* 10.9*  HGB 14.5 12.2*  HCT 43.5 37.3*   Recent Labs    12/18/22 2038 12/19/22 0546  NA 135 136  K 3.7 4.0  CL 98 98  CO2 26 28  GLUCOSE 190* 155*  BUN 22* 28*  CREATININE 1.26* 1.82*  CALCIUM 8.6* 8.0*   Recent Labs    12/19/22 0544  INR 1.0   Urinalysis    Component Value Date/Time   APPEARANCEUR Cloudy (A) 12/17/2022 1313   GLUCOSEU Negative 12/17/2022 1313   BILIRUBINUR Negative 12/17/2022 1313   PROTEINUR 1+ (A) 12/17/2022 1313   NITRITE Negative 12/17/2022 1313  LEUKOCYTESUR Negative 12/17/2022 1313   Results for orders placed or performed in visit on 12/17/22  Microscopic Examination     Status: Abnormal   Collection Time: 12/17/22  1:13 PM   Urine  Result Value Ref Range Status   WBC, UA 0-5 0 - 5 /hpf Final   RBC, Urine >30 (A) 0 - 2 /hpf Final   Epithelial Cells (non renal) 0-10 0 - 10 /hpf Final   Mucus, UA Present (A) Not Estab. Final   Bacteria, UA Few None seen/Few Final    Radiologic Imaging: CT Renal Stone Study  Result Date: 12/18/2022 CLINICAL DATA:  Abdominal and flank pain EXAM: CT ABDOMEN AND  PELVIS WITHOUT CONTRAST TECHNIQUE: Multidetector CT imaging of the abdomen and pelvis was performed following the standard protocol without IV contrast. RADIATION DOSE REDUCTION: This exam was performed according to the departmental dose-optimization program which includes automated exposure control, adjustment of the mA and/or kV according to patient size and/or use of iterative reconstruction technique. COMPARISON:  CT of the pelvis 09/14/2007 FINDINGS: Lower chest: No acute abnormality. Hepatobiliary: No focal liver abnormality is seen. No gallstones, gallbladder wall thickening, or biliary dilatation. Pancreas: Unremarkable. No pancreatic ductal dilatation or surrounding inflammatory changes. Spleen: Normal in size without focal abnormality. Adrenals/Urinary Tract: There are punctate nonobstructing bilateral renal calculi. Linear calcifications are seen in the right renal pelvis and renal collecting system. There is no hydronephrosis. No ureteral or bladder calculi are seen. There is heterogeneous perinephric high density subcapsular fluid collection surrounding the entire kidney measuring up to 4.8 cm in thickness. Renal hilum is spared. There is a additional fluid, stranding and hyperdense fluid in the right perirenal and pararenal space minimally extending along the right retroperitoneum into the pelvis. The adrenal glands and bladder are within normal limits. There is no left-sided hydronephrosis or perinephric fluid collection. Stomach/Bowel: Stomach is within normal limits. Appendix appears normal. No evidence of bowel wall thickening, distention, or inflammatory changes. There is sigmoid colon diverticulosis. Vascular/Lymphatic: Aortic atherosclerosis. No enlarged abdominal or pelvic lymph nodes. Reproductive: Prostate is unremarkable. Other: There is a small fat containing umbilical hernia. There is no ascites. Musculoskeletal: Posterior fusion hardware is seen at L5-S1 and L4. There is chronic appearing  anterolisthesis at L5-S1. IMPRESSION: 1. Large right perinephric fluid collection/hematoma measuring up to 4.8 cm in thickness. There is additional fluid, stranding and hyperdense fluid in the right perirenal and pararenal space minimally extending along the right retroperitoneum into the pelvis. Given the size and distribution of this subcapsular hematoma, patient is at risk for Page kidney. Recommend clinical correlation and follow-up. 2. Nonobstructing bilateral renal calculi with linear calcifications in the right renal pelvis and renal collecting system. 3. Sigmoid colon diverticulosis. 4. Small fat containing umbilical hernia. Aortic Atherosclerosis (ICD10-I70.0). Electronically Signed   By: Darliss Cheney M.D.   On: 12/18/2022 21:38   DG Abd 1 View  Result Date: 12/18/2022 CLINICAL DATA:  [id number] Right ureteral stone [id number]. EXAM: ABDOMEN - 1 VIEW COMPARISON:  Abdominal radiograph 12/17/2022. FINDINGS: Calculi project over the medial aspect of the right renal shadow and lower aspect of the left renal shadow. Prior L5-S1 posterior spinal fusion. Normal bowel-gas pattern. IMPRESSION: Bilateral nephrolithiasis. Electronically Signed   By: Orvan Falconer M.D.   On: 12/18/2022 13:00    Assessment & Plan:  57 year old male with PMH recurrent nephrolithiasis admitted after developing a large right subcapsular hematoma after undergoing right ESWL for management of a right renal stone.  He is hemodynamically stable.  No indication for urgent intervention at this time.  We discussed that he will require pain antigen and antiemetics for symptom control as well as serial labs for monitoring.  If his blood counts stabilize, he will not require further intervention and his body will resorb the hematoma on its own over time.  They expressed understanding.  Recommendations: -Pain control and antiemetics per primary team -Trend CBC -Trend vitals  Thank you for involving me in this patient's care, I will  continue to follow along.  Carman Ching, PA-C 12/19/2022 9:06 AM

## 2022-12-19 NOTE — Plan of Care (Signed)

## 2022-12-20 DIAGNOSIS — T148XXA Other injury of unspecified body region, initial encounter: Secondary | ICD-10-CM | POA: Diagnosis not present

## 2022-12-20 LAB — CBC WITH DIFFERENTIAL/PLATELET
Abs Immature Granulocytes: 0.05 10*3/uL (ref 0.00–0.07)
Basophils Absolute: 0.1 10*3/uL (ref 0.0–0.1)
Basophils Relative: 1 %
Eosinophils Absolute: 0.2 10*3/uL (ref 0.0–0.5)
Eosinophils Relative: 2 %
HCT: 35.1 % — ABNORMAL LOW (ref 39.0–52.0)
Hemoglobin: 11.6 g/dL — ABNORMAL LOW (ref 13.0–17.0)
Immature Granulocytes: 1 %
Lymphocytes Relative: 22 %
Lymphs Abs: 2.1 10*3/uL (ref 0.7–4.0)
MCH: 30.2 pg (ref 26.0–34.0)
MCHC: 33 g/dL (ref 30.0–36.0)
MCV: 91.4 fL (ref 80.0–100.0)
Monocytes Absolute: 1 10*3/uL (ref 0.1–1.0)
Monocytes Relative: 11 %
Neutro Abs: 5.8 10*3/uL (ref 1.7–7.7)
Neutrophils Relative %: 63 %
Platelets: 224 10*3/uL (ref 150–400)
RBC: 3.84 MIL/uL — ABNORMAL LOW (ref 4.22–5.81)
RDW: 13.5 % (ref 11.5–15.5)
WBC: 9.2 10*3/uL (ref 4.0–10.5)
nRBC: 0 % (ref 0.0–0.2)

## 2022-12-20 LAB — GLUCOSE, CAPILLARY
Glucose-Capillary: 121 mg/dL — ABNORMAL HIGH (ref 70–99)
Glucose-Capillary: 119 mg/dL — ABNORMAL HIGH (ref 70–99)
Glucose-Capillary: 135 mg/dL — ABNORMAL HIGH (ref 70–99)
Glucose-Capillary: 122 mg/dL — ABNORMAL HIGH (ref 70–99)

## 2022-12-20 LAB — BASIC METABOLIC PANEL
Anion gap: 7 (ref 5–15)
BUN: 33 mg/dL — ABNORMAL HIGH (ref 6–20)
CO2: 27 mmol/L (ref 22–32)
Calcium: 8.4 mg/dL — ABNORMAL LOW (ref 8.9–10.3)
Chloride: 98 mmol/L (ref 98–111)
Creatinine, Ser: 1.36 mg/dL — ABNORMAL HIGH (ref 0.61–1.24)
GFR, Estimated: >60 mL/min (ref >60)
Glucose, Bld: 128 mg/dL — ABNORMAL HIGH (ref 70–99)
Potassium: 4.1 mmol/L (ref 3.5–5.1)
Sodium: 132 mmol/L — ABNORMAL LOW (ref 135–145)

## 2022-12-20 MED ORDER — METHOCARBAMOL 500 MG PO TABS
500.0000 mg | ORAL_TABLET | Freq: Three times a day (TID) | ORAL | Status: DC
  Administered 2022-12-20 – 2022-12-21 (×4): 500 mg via ORAL
  Filled 2022-12-20 (×5): qty 1

## 2022-12-20 MED ORDER — KETOROLAC TROMETHAMINE 15 MG/ML IJ SOLN
15.0000 mg | Freq: Four times a day (QID) | INTRAMUSCULAR | Status: AC
  Administered 2022-12-20 – 2022-12-22 (×8): 15 mg via INTRAVENOUS
  Filled 2022-12-20 (×8): qty 1

## 2022-12-20 NOTE — Progress Notes (Signed)
PROGRESS NOTE    Bradley Lynch  MVH:[id number] DOB: [birth year] DOA: 12/18/2022 PCP: Luciana Axe, NP    Brief Narrative:   57 y.o. male with medical history significant of hypertension, hyperlipidemia, diabetes mellitus, BPH, obesity, right UPJ obstructive stone, s/p of lithotripsy, who presents with right flank pain.   Due to right UPJ obstructive kidney stone, patient underwent ESWL lithotripsy by Dr. Apolinar Junes of urology today.  After procedure, patient developed right flank pain, which is constant, sharp, severe, nonradiating, associated with hematuria.  No dysuria or burning on urination.  No fever or chills.  Not aggravated or alleviated by any known factors.  No chest pain, cough, shortness of breath.  No nausea, vomiting, diarrhea or abdominal pain.  Seen in consultation by urology.  Recommend intravenous fluids, pain control, antiemetics.   Assessment & Plan:   Principal Problem:   Hematoma_right renal subscapular hematoma Active Problems:   Obstruction of right ureteropelvic junction (UPJ) due to stone   Hypertension   Hyperlipidemia   Diabetes mellitus without complication (HCC)   BPH (benign prostatic hyperplasia)   Obesity (BMI 30-39.9)   Intractable pain  Hematoma_right renal subscapular hematoma and obstruction of right ureteropelvic junction (UPJ) due to stone: CT scan showed subcapsular hematoma.  Hemoglobin stable.  Urology consulted.  Patient admitted to medicine for treatment of intractable pain.  Hemoglobin and kidney function have improved but patient remains in significant pain Plan: Continue multimodal pain control Flomax New IVF Tentative discharge plan 9/22 with pain controlled  Intractable pain Secondary to subcapsular hematoma Multimodal pain control See above for plan  AKI Kidney function improving Continue Lactated Ringer's 125 cc/h Recheck creatinine in a.m.   Hypertension -IV hydralazine as needed -Prinzide, felodipine    Hyperlipidemia -Zocor   Diabetes mellitus without complication [facility]): Recent A1c 6.7, well-controlled.  Patient taking metformin -Scale insulin   Obesity (BMI 30-39.9): Body weight 102.1 kg, BMI 31.39 -Encouraged to lose weight -Exercise and healthy diet       DVT prophylaxis: SCD Code Status: Full Family Communication: Spouse at bedside 9/20 Disposition Plan: Status is: Inpatient Remains inpatient appropriate because: Intractable pain   Level of care: Med-Surg  Consultants:  Urology  Procedures:  None  Antimicrobials: None   Subjective: Seen and examined.  Remains in pain.  Objective: Vitals:   12/19/22 1830 12/19/22 2149 12/20/22 0542 12/20/22 0724  BP: (!) 131/59 128/70 129/82 136/82  Pulse: 70 69 65 65  Resp:  17 17 18   Temp:  98 F (36.7 C) 97.7 F (36.5 C) 98.2 F (36.8 C)  TempSrc:  Oral Oral Oral  SpO2: 94% 93% 91% 91%  Weight:      Height:        Intake/Output Summary (Last 24 hours) at 12/20/2022 1129 Last data filed at 12/20/2022 1051 Gross per 24 hour  Intake 1212.26 ml  Output 700 ml  Net 512.26 ml   Filed Weights   12/18/22 1953  Weight: 102.1 kg    Examination:  General exam: Mild distress due to pain Respiratory system: Clear to auscultation. Respiratory effort normal. Cardiovascular system: S1-S2, RRR, no murmurs, no pedal edema Gastrointestinal system: Obese, soft, NT/ND, bowel sounds Central nervous system: Alert and oriented. No focal neurological deficits. Extremities: Symmetric 5 x 5 power. Skin: No rashes, lesions or ulcers Psychiatry: Judgement and insight appear normal. Mood & affect appropriate.     Data Reviewed: I have personally reviewed following labs and imaging studies  CBC: Recent Labs  Lab 12/18/22 2038  12/19/22 0546 12/19/22 1521 12/19/22 1752 12/20/22 0809  WBC 11.9* 10.9* 10.9* 9.6 9.2  NEUTROABS 10.0*  --   --   --  5.8  HGB 14.5 12.2* 12.2* 11.6* 11.6*  HCT 43.5 37.3* 36.0* 34.9* 35.1*   MCV 90.1 91.6 89.8 91.4 91.4  PLT 271 247 235 226 224   Basic Metabolic Panel: Recent Labs  Lab 12/18/22 2038 12/19/22 0546 12/20/22 0809  NA 135 136 132*  K 3.7 4.0 4.1  CL 98 98 98  CO2 26 28 27   GLUCOSE 190* 155* 128*  BUN 22* 28* 33*  CREATININE 1.26* 1.82* 1.36*  CALCIUM 8.6* 8.0* 8.4*   GFR: Estimated Creatinine Clearance: 72 mL/min (A) (by C-G formula based on SCr of 1.36 mg/dL (H)). Liver Function Tests: Recent Labs  Lab 12/18/22 2038  AST 21  ALT 20  ALKPHOS 54  BILITOT 1.1  PROT 6.4*  ALBUMIN 3.6   No results for input(s): "LIPASE", "AMYLASE" in the last 168 hours. No results for input(s): "AMMONIA" in the last 168 hours. Coagulation Profile: Recent Labs  Lab 12/19/22 0544  INR 1.0   Cardiac Enzymes: No results for input(s): "CKTOTAL", "CKMB", "CKMBINDEX", "TROPONINI" in the last 168 hours. BNP (last 3 results) No results for input(s): "PROBNP" in the last 8760 hours. HbA1C: No results for input(s): "HGBA1C" in the last 72 hours. CBG: Recent Labs  Lab 12/19/22 0828 12/19/22 1143 12/19/22 1723 12/19/22 2151 12/20/22 0727  GLUCAP 143* 149* 148* 161* 121*   Lipid Profile: No results for input(s): "CHOL", "HDL", "LDLCALC", "TRIG", "CHOLHDL", "LDLDIRECT" in the last 72 hours. Thyroid Function Tests: No results for input(s): "TSH", "T4TOTAL", "FREET4", "T3FREE", "THYROIDAB" in the last 72 hours. Anemia Panel: No results for input(s): "VITAMINB12", "FOLATE", "FERRITIN", "TIBC", "IRON", "RETICCTPCT" in the last 72 hours. Sepsis Labs: No results for input(s): "PROCALCITON", "LATICACIDVEN" in the last 168 hours.  Recent Results (from the past 240 hour(s))  Microscopic Examination     Status: Abnormal   Collection Time: 12/17/22  1:13 PM   Urine  Result Value Ref Range Status   WBC, UA 0-5 0 - 5 /hpf Final   RBC, Urine >30 (A) 0 - 2 /hpf Final   Epithelial Cells (non renal) 0-10 0 - 10 /hpf Final   Mucus, UA Present (A) Not Estab. Final    Bacteria, UA Few None seen/Few Final  CULTURE, URINE COMPREHENSIVE     Status: None (Preliminary result)   Collection Time: 12/17/22  2:25 PM   Specimen: Urine   UR  Result Value Ref Range Status   Urine Culture, Comprehensive Preliminary report  Preliminary   Organism ID, Bacteria Comment  Preliminary    Comment: Specimen has been received and testing has been initiated.         Radiology Studies: CT Renal Stone Study  Result Date: 12/18/2022 CLINICAL DATA:  Abdominal and flank pain EXAM: CT ABDOMEN AND PELVIS WITHOUT CONTRAST TECHNIQUE: Multidetector CT imaging of the abdomen and pelvis was performed following the standard protocol without IV contrast. RADIATION DOSE REDUCTION: This exam was performed according to the departmental dose-optimization program which includes automated exposure control, adjustment of the mA and/or kV according to patient size and/or use of iterative reconstruction technique. COMPARISON:  CT of the pelvis 09/14/2007 FINDINGS: Lower chest: No acute abnormality. Hepatobiliary: No focal liver abnormality is seen. No gallstones, gallbladder wall thickening, or biliary dilatation. Pancreas: Unremarkable. No pancreatic ductal dilatation or surrounding inflammatory changes. Spleen: Normal in size without focal abnormality. Adrenals/Urinary  Tract: There are punctate nonobstructing bilateral renal calculi. Linear calcifications are seen in the right renal pelvis and renal collecting system. There is no hydronephrosis. No ureteral or bladder calculi are seen. There is heterogeneous perinephric high density subcapsular fluid collection surrounding the entire kidney measuring up to 4.8 cm in thickness. Renal hilum is spared. There is a additional fluid, stranding and hyperdense fluid in the right perirenal and pararenal space minimally extending along the right retroperitoneum into the pelvis. The adrenal glands and bladder are within normal limits. There is no left-sided  hydronephrosis or perinephric fluid collection. Stomach/Bowel: Stomach is within normal limits. Appendix appears normal. No evidence of bowel wall thickening, distention, or inflammatory changes. There is sigmoid colon diverticulosis. Vascular/Lymphatic: Aortic atherosclerosis. No enlarged abdominal or pelvic lymph nodes. Reproductive: Prostate is unremarkable. Other: There is a small fat containing umbilical hernia. There is no ascites. Musculoskeletal: Posterior fusion hardware is seen at L5-S1 and L4. There is chronic appearing anterolisthesis at L5-S1. IMPRESSION: 1. Large right perinephric fluid collection/hematoma measuring up to 4.8 cm in thickness. There is additional fluid, stranding and hyperdense fluid in the right perirenal and pararenal space minimally extending along the right retroperitoneum into the pelvis. Given the size and distribution of this subcapsular hematoma, patient is at risk for Page kidney. Recommend clinical correlation and follow-up. 2. Nonobstructing bilateral renal calculi with linear calcifications in the right renal pelvis and renal collecting system. 3. Sigmoid colon diverticulosis. 4. Small fat containing umbilical hernia. Aortic Atherosclerosis (ICD10-I70.0). Electronically Signed   By: Darliss Cheney M.D.   On: 12/18/2022 21:38        Scheduled Meds:  felodipine  5 mg Oral Daily   fluticasone  1 spray Each Nare Daily   insulin aspart  0-5 Units Subcutaneous QHS   insulin aspart  0-9 Units Subcutaneous TID WC   ketorolac  15 mg Intravenous Q6H   loratadine  10 mg Oral Daily   methocarbamol  500 mg Oral TID   montelukast  10 mg Oral QHS   simvastatin  40 mg Oral q1800   tamsulosin  0.4 mg Oral Daily   Continuous Infusions:  lactated ringers 125 mL/hr at 12/20/22 0837     LOS: 2 days     Tresa Moore, MD [facility]   If 7PM-7AM, please contact night-coverage  12/20/2022, 11:29 AM

## 2022-12-20 NOTE — Plan of Care (Signed)

## 2022-12-21 ENCOUNTER — Inpatient Hospital Stay: Payer: BC Managed Care – PPO

## 2022-12-21 DIAGNOSIS — T148XXA Other injury of unspecified body region, initial encounter: Secondary | ICD-10-CM | POA: Diagnosis not present

## 2022-12-21 LAB — CBC WITH DIFFERENTIAL/PLATELET
Abs Immature Granulocytes: 0.04 10*3/uL (ref 0.00–0.07)
Basophils Absolute: 0.1 10*3/uL (ref 0.0–0.1)
Basophils Relative: 1 %
Eosinophils Absolute: 0.3 10*3/uL (ref 0.0–0.5)
Eosinophils Relative: 3 %
HCT: 33.6 % — ABNORMAL LOW (ref 39.0–52.0)
Hemoglobin: 11.2 g/dL — ABNORMAL LOW (ref 13.0–17.0)
Immature Granulocytes: 0 %
Lymphocytes Relative: 15 %
Lymphs Abs: 1.4 10*3/uL (ref 0.7–4.0)
MCH: 30.3 pg (ref 26.0–34.0)
MCHC: 33.3 g/dL (ref 30.0–36.0)
MCV: 90.8 fL (ref 80.0–100.0)
Monocytes Absolute: 1 10*3/uL (ref 0.1–1.0)
Monocytes Relative: 11 %
Neutro Abs: 6.8 10*3/uL (ref 1.7–7.7)
Neutrophils Relative %: 70 %
Platelets: 228 10*3/uL (ref 150–400)
RBC: 3.7 MIL/uL — ABNORMAL LOW (ref 4.22–5.81)
RDW: 13.3 % (ref 11.5–15.5)
WBC: 9.6 10*3/uL (ref 4.0–10.5)
nRBC: 0 % (ref 0.0–0.2)

## 2022-12-21 LAB — BASIC METABOLIC PANEL
Anion gap: 7 (ref 5–15)
BUN: 27 mg/dL — ABNORMAL HIGH (ref 6–20)
CO2: 28 mmol/L (ref 22–32)
Calcium: 8.5 mg/dL — ABNORMAL LOW (ref 8.9–10.3)
Chloride: 99 mmol/L (ref 98–111)
Creatinine, Ser: 1.06 mg/dL (ref 0.61–1.24)
GFR, Estimated: >60 mL/min (ref >60)
Glucose, Bld: 145 mg/dL — ABNORMAL HIGH (ref 70–99)
Potassium: 4.4 mmol/L (ref 3.5–5.1)
Sodium: 134 mmol/L — ABNORMAL LOW (ref 135–145)

## 2022-12-21 LAB — GLUCOSE, CAPILLARY
Glucose-Capillary: 138 mg/dL — ABNORMAL HIGH (ref 70–99)
Glucose-Capillary: 133 mg/dL — ABNORMAL HIGH (ref 70–99)
Glucose-Capillary: 153 mg/dL — ABNORMAL HIGH (ref 70–99)
Glucose-Capillary: 103 mg/dL — ABNORMAL HIGH (ref 70–99)

## 2022-12-21 MED ORDER — SODIUM CHLORIDE 0.9 % IV SOLN
12.5000 mg | Freq: Four times a day (QID) | INTRAVENOUS | Status: DC | PRN
  Filled 2022-12-21: qty 0.5

## 2022-12-21 MED ORDER — BISACODYL 10 MG RE SUPP
10.0000 mg | Freq: Every day | RECTAL | Status: DC | PRN
  Filled 2022-12-21 (×2): qty 1

## 2022-12-21 MED ORDER — FLEET ENEMA RE ENEM
1.0000 | ENEMA | Freq: Once | RECTAL | Status: AC
  Administered 2022-12-21: 1 via RECTAL

## 2022-12-21 MED ORDER — SODIUM CHLORIDE 0.9 % IV SOLN
25.0000 mg | Freq: Once | INTRAVENOUS | Status: AC
  Administered 2022-12-21: 25 mg via INTRAVENOUS
  Filled 2022-12-21: qty 1

## 2022-12-21 MED ORDER — BISACODYL 10 MG RE SUPP
10.0000 mg | Freq: Every day | RECTAL | Status: DC | PRN
  Administered 2022-12-21 – 2022-12-22 (×2): 10 mg via RECTAL

## 2022-12-21 MED ORDER — SENNOSIDES-DOCUSATE SODIUM 8.6-50 MG PO TABS
1.0000 | ORAL_TABLET | Freq: Two times a day (BID) | ORAL | Status: DC
  Administered 2022-12-21 – 2022-12-22 (×4): 1 via ORAL
  Filled 2022-12-21 (×4): qty 1

## 2022-12-21 MED ORDER — POLYETHYLENE GLYCOL 3350 17 G PO PACK
17.0000 g | PACK | Freq: Every day | ORAL | Status: DC
  Administered 2022-12-21 – 2022-12-22 (×2): 17 g via ORAL
  Filled 2022-12-21 (×2): qty 1

## 2022-12-21 NOTE — Significant Event (Signed)
Called to bedside this afternoon.  Patient with persistent abd pain, intractable n/v.  Has not been able to tolerate enema due to body positioning causing emesis.  KUB with moderate large stool burden, no obstruction.  Belly tender on exam, non-peritonitic, not tense.  Labs this AM show stable Hb and normalization of white count and kidney function.  Vitals stable  Plan: Aggressive anti-emetics Suppository Enema  If pain persists despite above interventions, recommend re-check CBC, BMP, LA, abd ct stone study to evaluate peri nephric hematoma  Lolita Patella MD

## 2022-12-21 NOTE — Progress Notes (Signed)
Pt refused tap water enema. Pt was educated and he verbalized understanding.

## 2022-12-21 NOTE — Progress Notes (Signed)
PROGRESS NOTE    Bradley Lynch  ZOX:[id number] DOB: April 26, 1965 DOA: 12/18/2022 PCP: Luciana Axe, NP    Brief Narrative:   57 y.o. male with medical history significant of hypertension, hyperlipidemia, diabetes mellitus, BPH, obesity, right UPJ obstructive stone, s/p of lithotripsy, who presents with right flank pain.   Due to right UPJ obstructive kidney stone, patient underwent ESWL lithotripsy by Dr. Apolinar Junes of urology today.  After procedure, patient developed right flank pain, which is constant, sharp, severe, nonradiating, associated with hematuria.  No dysuria or burning on urination.  No fever or chills.  Not aggravated or alleviated by any known factors.  No chest pain, cough, shortness of breath.  No nausea, vomiting, diarrhea or abdominal pain.  Seen in consultation by urology.  Recommend intravenous fluids, pain control, antiemetics.   Assessment & Plan:   Principal Problem:   Hematoma_right renal subscapular hematoma Active Problems:   Obstruction of right ureteropelvic junction (UPJ) due to stone   Hypertension   Hyperlipidemia   Diabetes mellitus without complication (HCC)   BPH (benign prostatic hyperplasia)   Obesity (BMI 30-39.9)   Intractable pain  Hematoma_right renal subscapular hematoma and obstruction of right ureteropelvic junction (UPJ) due to stone: CT scan showed subcapsular hematoma.  Hemoglobin stable.  Urology consulted.  Patient admitted to medicine for treatment of intractable pain.  Hemoglobin and kidney function have improved but patient remains in significant pain Plan: Continue multimodal pain control Continue Flomax  Intravenous fluids Hopefully discharge in 24 hours if pain is controlled  Intractable pain Secondary to subcapsular hematoma Continues to endorse severe pain Multimodal pain control See above for plan  Constipation Multimodal bowel regimen Fleet enema 9/22  AKI Kidney function improving Continue lactated Ringer's  at 75 cc/h Recheck creatinine in a.m.   Hypertension -IV hydralazine as needed -Prinzide, felodipine   Hyperlipidemia -Zocor   Diabetes mellitus without complication [facility]): Recent A1c 6.7, well-controlled.  Patient taking metformin -Scale insulin   Obesity (BMI 30-39.9): Body weight 102.1 kg, BMI 31.39 -Encouraged to lose weight -Exercise and healthy diet       DVT prophylaxis: SCD Code Status: Full Family Communication: Spouse at bedside 9/20 Disposition Plan: Status is: Inpatient Remains inpatient appropriate because: Intractable pain   Level of care: Med-Surg  Consultants:  Urology  Procedures:  None  Antimicrobials: None   Subjective: Seen and examined.  Continues to endorse significant right flank and abdominal pain  Objective: Vitals:   12/20/22 1649 12/20/22 1940 12/21/22 0349 12/21/22 0806  BP: (!) 146/87 (!) 141/77 (!) 140/96 (!) 150/88  Pulse: 70 63 68 72  Resp: 16 19 18 20   Temp: 98.3 F (36.8 C) 98 F (36.7 C) 98.7 F (37.1 C) 97.9 F (36.6 C)  TempSrc: Oral   Oral  SpO2: 98% 91% 94% 93%  Weight:      Height:        Intake/Output Summary (Last 24 hours) at 12/21/2022 1135 Last data filed at 12/21/2022 1003 Gross per 24 hour  Intake 240 ml  Output 1875 ml  Net -1635 ml   Filed Weights   12/18/22 1953  Weight: 102.1 kg    Examination:  General exam: Mild distress due to pain Respiratory system: Lungs clear.  Normal work of breathing.  Room air Cardiovascular system: S1-S2, RRR, no murmurs, no pedal edema Gastrointestinal system: Obese, soft, NT/ND, bowel sounds Central nervous system: Alert and oriented. No focal neurological deficits. Extremities: Symmetric 5 x 5 power. Skin: No rashes, lesions or  ulcers Psychiatry: Judgement and insight appear normal. Mood & affect appropriate.     Data Reviewed: I have personally reviewed following labs and imaging studies  CBC: Recent Labs  Lab 12/18/22 2038 12/19/22 0546  12/19/22 1521 12/19/22 1752 12/20/22 0809 12/21/22 0810  WBC 11.9* 10.9* 10.9* 9.6 9.2 9.6  NEUTROABS 10.0*  --   --   --  5.8 6.8  HGB 14.5 12.2* 12.2* 11.6* 11.6* 11.2*  HCT 43.5 37.3* 36.0* 34.9* 35.1* 33.6*  MCV 90.1 91.6 89.8 91.4 91.4 90.8  PLT 271 247 235 226 224 228   Basic Metabolic Panel: Recent Labs  Lab 12/18/22 2038 12/19/22 0546 12/20/22 0809 12/21/22 0810  NA 135 136 132* 134*  K 3.7 4.0 4.1 4.4  CL 98 98 98 99  CO2 26 28 27 28   GLUCOSE 190* 155* 128* 145*  BUN 22* 28* 33* 27*  CREATININE 1.26* 1.82* 1.36* 1.06  CALCIUM 8.6* 8.0* 8.4* 8.5*   GFR: Estimated Creatinine Clearance: 92.4 mL/min (by C-G formula based on SCr of 1.06 mg/dL). Liver Function Tests: Recent Labs  Lab 12/18/22 2038  AST 21  ALT 20  ALKPHOS 54  BILITOT 1.1  PROT 6.4*  ALBUMIN 3.6   No results for input(s): "LIPASE", "AMYLASE" in the last 168 hours. No results for input(s): "AMMONIA" in the last 168 hours. Coagulation Profile: Recent Labs  Lab 12/19/22 0544  INR 1.0   Cardiac Enzymes: No results for input(s): "CKTOTAL", "CKMB", "CKMBINDEX", "TROPONINI" in the last 168 hours. BNP (last 3 results) No results for input(s): "PROBNP" in the last 8760 hours. HbA1C: No results for input(s): "HGBA1C" in the last 72 hours. CBG: Recent Labs  Lab 12/20/22 0727 12/20/22 1208 12/20/22 1646 12/20/22 2113 12/21/22 0803  GLUCAP 121* 119* 135* 122* 138*   Lipid Profile: No results for input(s): "CHOL", "HDL", "LDLCALC", "TRIG", "CHOLHDL", "LDLDIRECT" in the last 72 hours. Thyroid Function Tests: No results for input(s): "TSH", "T4TOTAL", "FREET4", "T3FREE", "THYROIDAB" in the last 72 hours. Anemia Panel: No results for input(s): "VITAMINB12", "FOLATE", "FERRITIN", "TIBC", "IRON", "RETICCTPCT" in the last 72 hours. Sepsis Labs: No results for input(s): "PROCALCITON", "LATICACIDVEN" in the last 168 hours.  Recent Results (from the past 240 hour(s))  Microscopic Examination      Status: Abnormal   Collection Time: 12/17/22  1:13 PM   Urine  Result Value Ref Range Status   WBC, UA 0-5 0 - 5 /hpf Final   RBC, Urine >30 (A) 0 - 2 /hpf Final   Epithelial Cells (non renal) 0-10 0 - 10 /hpf Final   Mucus, UA Present (A) Not Estab. Final   Bacteria, UA Few None seen/Few Final  CULTURE, URINE COMPREHENSIVE     Status: None (Preliminary result)   Collection Time: 12/17/22  2:25 PM   Specimen: Urine   UR  Result Value Ref Range Status   Urine Culture, Comprehensive Preliminary report  Preliminary   Organism ID, Bacteria Comment  Preliminary    Comment: Microbiological testing to rule out the presence of possible pathogens is in progress.          Radiology Studies: No results found.      Scheduled Meds:  felodipine  5 mg Oral Daily   fluticasone  1 spray Each Nare Daily   insulin aspart  0-5 Units Subcutaneous QHS   insulin aspart  0-9 Units Subcutaneous TID WC   ketorolac  15 mg Intravenous Q6H   loratadine  10 mg Oral Daily   methocarbamol  500 mg Oral TID   montelukast  10 mg Oral QHS   polyethylene glycol  17 g Oral Daily   senna-docusate  1 tablet Oral BID   simvastatin  40 mg Oral q1800   sodium phosphate  1 enema Rectal Once   tamsulosin  0.4 mg Oral Daily   Continuous Infusions:  lactated ringers 125 mL/hr at 12/21/22 0865     LOS: 3 days     Tresa Moore, MD [facility]   If 7PM-7AM, please contact night-coverage  12/21/2022, 11:35 AM

## 2022-12-21 NOTE — Plan of Care (Signed)

## 2022-12-22 ENCOUNTER — Encounter: Payer: Self-pay | Admitting: Urology

## 2022-12-22 DIAGNOSIS — T148XXA Other injury of unspecified body region, initial encounter: Secondary | ICD-10-CM | POA: Diagnosis not present

## 2022-12-22 LAB — BASIC METABOLIC PANEL
Anion gap: 8 (ref 5–15)
BUN: 21 mg/dL — ABNORMAL HIGH (ref 6–20)
CO2: 28 mmol/L (ref 22–32)
Calcium: 8.6 mg/dL — ABNORMAL LOW (ref 8.9–10.3)
Chloride: 101 mmol/L (ref 98–111)
Creatinine, Ser: 1 mg/dL (ref 0.61–1.24)
GFR, Estimated: >60 mL/min (ref >60)
Glucose, Bld: 130 mg/dL — ABNORMAL HIGH (ref 70–99)
Potassium: 4.1 mmol/L (ref 3.5–5.1)
Sodium: 137 mmol/L (ref 135–145)

## 2022-12-22 LAB — CBC WITH DIFFERENTIAL/PLATELET
Abs Immature Granulocytes: 0.05 10*3/uL (ref 0.00–0.07)
Basophils Absolute: 0 10*3/uL (ref 0.0–0.1)
Basophils Relative: 0 %
Eosinophils Absolute: 0.1 10*3/uL (ref 0.0–0.5)
Eosinophils Relative: 1 %
HCT: 32 % — ABNORMAL LOW (ref 39.0–52.0)
Hemoglobin: 10.9 g/dL — ABNORMAL LOW (ref 13.0–17.0)
Immature Granulocytes: 1 %
Lymphocytes Relative: 14 %
Lymphs Abs: 1.3 10*3/uL (ref 0.7–4.0)
MCH: 30.2 pg (ref 26.0–34.0)
MCHC: 34.1 g/dL (ref 30.0–36.0)
MCV: 88.6 fL (ref 80.0–100.0)
Monocytes Absolute: 1.1 10*3/uL — ABNORMAL HIGH (ref 0.1–1.0)
Monocytes Relative: 12 %
Neutro Abs: 6.5 10*3/uL (ref 1.7–7.7)
Neutrophils Relative %: 72 %
Platelets: 261 10*3/uL (ref 150–400)
RBC: 3.61 MIL/uL — ABNORMAL LOW (ref 4.22–5.81)
RDW: 13.2 % (ref 11.5–15.5)
WBC: 9.1 10*3/uL (ref 4.0–10.5)
nRBC: 0 % (ref 0.0–0.2)

## 2022-12-22 LAB — GLUCOSE, CAPILLARY: Glucose-Capillary: 116 mg/dL — ABNORMAL HIGH (ref 70–99)

## 2022-12-22 MED ORDER — FLEET ENEMA RE ENEM
1.0000 | ENEMA | Freq: Once | RECTAL | Status: AC
  Administered 2022-12-22: 1 via RECTAL

## 2022-12-22 MED ORDER — METHOCARBAMOL 500 MG PO TABS
500.0000 mg | ORAL_TABLET | Freq: Three times a day (TID) | ORAL | Status: DC
  Administered 2022-12-22 – 2022-12-23 (×3): 500 mg via ORAL
  Filled 2022-12-22 (×3): qty 1

## 2022-12-22 MED ORDER — KETOROLAC TROMETHAMINE 15 MG/ML IJ SOLN: 15.0000 mg | Freq: Four times a day (QID) | INTRAMUSCULAR | Status: DC

## 2022-12-22 MED ORDER — KETOROLAC TROMETHAMINE 15 MG/ML IJ SOLN
15.0000 mg | Freq: Four times a day (QID) | INTRAMUSCULAR | Status: DC
  Administered 2022-12-22 – 2022-12-23 (×2): 15 mg via INTRAVENOUS
  Filled 2022-12-22 (×3): qty 1

## 2022-12-22 NOTE — Progress Notes (Signed)
Order received from Dr Georgeann Oppenheim to discontinue telemetry

## 2022-12-22 NOTE — Progress Notes (Signed)
Patient is "hurting pretty good". He is asking for a muscle relaxer. No current order for muscles relaxer. he did take tylenol earlier today and the only other option is dillaudid and he is trying to avoid it. Orders placed by MD for ketorolac and robaxin

## 2022-12-22 NOTE — Progress Notes (Signed)
PROGRESS NOTE    Bradley Lynch  NGE:[id number] DOB: [birth year] DOA: 12/18/2022 PCP: Luciana Axe, NP    Brief Narrative:   57 y.o. male with medical history significant of hypertension, hyperlipidemia, diabetes mellitus, BPH, obesity, right UPJ obstructive stone, s/p of lithotripsy, who presents with right flank pain.   Due to right UPJ obstructive kidney stone, patient underwent ESWL lithotripsy by Dr. Apolinar Junes of urology today.  After procedure, patient developed right flank pain, which is constant, sharp, severe, nonradiating, associated with hematuria.  No dysuria or burning on urination.  No fever or chills.  Not aggravated or alleviated by any known factors.  No chest pain, cough, shortness of breath.  No nausea, vomiting, diarrhea or abdominal pain.  Seen in consultation by urology.  Recommend intravenous fluids, pain control, antiemetics.   Assessment & Plan:   Principal Problem:   Hematoma_right renal subscapular hematoma Active Problems:   Obstruction of right ureteropelvic junction (UPJ) due to stone   Hypertension   Hyperlipidemia   Diabetes mellitus without complication (HCC)   BPH (benign prostatic hyperplasia)   Obesity (BMI 30-39.9)   Intractable pain  Hematoma_right renal subscapular hematoma and obstruction of right ureteropelvic junction (UPJ) due to stone: CT scan showed subcapsular hematoma.  Hemoglobin stable.  Urology consulted.  Patient admitted to medicine for treatment of intractable pain.  Hemoglobin and kidney function have improved but patient remains in significant pain Plan: Continue multimodal pain control.  Avoid narcotics as able Continue Flomax  Intravenous fluids Hopefully discharge in 24 hours if pain is controlled  Intractable pain Secondary to subcapsular hematoma Continues to endorse severe pain Multimodal pain control See above for plan  Constipation Multimodal bowel regimen Fleet enema 9/22 Repeat Fleet enema 9/23.  If  ineffective will need a soapsuds versus other large-volume enema  AKI Kidney function normal last Continue lactated Ringer's at 75 cc/h   Hypertension -IV hydralazine as needed -Prinzide, felodipine   Hyperlipidemia -Zocor   Diabetes mellitus without complication (HCC): Recent A1c 6.7, well-controlled.  Patient taking metformin -Scale insulin   Obesity (BMI 30-39.9): Body weight 102.1 kg, BMI 31.39 -Encouraged to lose weight -Exercise and healthy diet       DVT prophylaxis: SCD Code Status: Full Family Communication: Spouse at bedside 9/20, 9/22 Disposition Plan: Status is: Inpatient Remains inpatient appropriate because: Intractable pain   Level of care: Med-Surg  Consultants:  Urology  Procedures:  None  Antimicrobials: None   Subjective: Seen and examined.  Main complaint is abdominal pain secondary to constipation.  Objective: Vitals:   12/21/22 1512 12/21/22 1936 12/22/22 0459 12/22/22 0755  BP: (!) 153/85 (!) 151/75 (!) 156/89 (!) 151/87  Pulse: 79 71 (!) 57 77  Resp: 20 18 20 18   Temp: 97.7 F (36.5 C) 98.7 F (37.1 C) 98.2 F (36.8 C) 98.4 F (36.9 C)  TempSrc: Oral  Oral   SpO2: 93% 92% 96% 95%  Weight:      Height:        Intake/Output Summary (Last 24 hours) at 12/22/2022 1145 Last data filed at 12/21/2022 1254 Gross per 24 hour  Intake --  Output 600 ml  Net -600 ml   Filed Weights   12/18/22 1953  Weight: 102.1 kg    Examination:  General exam: Appears fatigued Respiratory system: Lungs clear.  Normal work of breathing.  Room air Cardiovascular system: S1-S2, RRR, no murmurs, no pedal edema Gastrointestinal system: Obese, soft, mild tenderness to palpation, positive bowel sounds Central nervous system:  Alert and oriented. No focal neurological deficits. Extremities: Symmetric 5 x 5 power. Skin: No rashes, lesions or ulcers Psychiatry: Judgement and insight appear normal. Mood & affect appropriate.     Data Reviewed: I  have personally reviewed following labs and imaging studies  CBC: Recent Labs  Lab 12/18/22 2038 12/19/22 0546 12/19/22 1521 12/19/22 1752 12/20/22 0809 12/21/22 0810 12/22/22 0716  WBC 11.9*   < > 10.9* 9.6 9.2 9.6 9.1  NEUTROABS 10.0*  --   --   --  5.8 6.8 6.5  HGB 14.5   < > 12.2* 11.6* 11.6* 11.2* 10.9*  HCT 43.5   < > 36.0* 34.9* 35.1* 33.6* 32.0*  MCV 90.1   < > 89.8 91.4 91.4 90.8 88.6  PLT 271   < > 235 226 224 228 261   < > = values in this interval not displayed.   Basic Metabolic Panel: Recent Labs  Lab 12/18/22 2038 12/19/22 0546 12/20/22 0809 12/21/22 0810 12/22/22 0716  NA 135 136 132* 134* 137  K 3.7 4.0 4.1 4.4 4.1  CL 98 98 98 99 101  CO2 26 28 27 28 28   GLUCOSE 190* 155* 128* 145* 130*  BUN 22* 28* 33* 27* 21*  CREATININE 1.26* 1.82* 1.36* 1.06 1.00  CALCIUM 8.6* 8.0* 8.4* 8.5* 8.6*   GFR: Estimated Creatinine Clearance: 97.9 mL/min (by C-G formula based on SCr of 1 mg/dL). Liver Function Tests: Recent Labs  Lab 12/18/22 2038  AST 21  ALT 20  ALKPHOS 54  BILITOT 1.1  PROT 6.4*  ALBUMIN 3.6   No results for input(s): "LIPASE", "AMYLASE" in the last 168 hours. No results for input(s): "AMMONIA" in the last 168 hours. Coagulation Profile: Recent Labs  Lab 12/19/22 0544  INR 1.0   Cardiac Enzymes: No results for input(s): "CKTOTAL", "CKMB", "CKMBINDEX", "TROPONINI" in the last 168 hours. BNP (last 3 results) No results for input(s): "PROBNP" in the last 8760 hours. HbA1C: No results for input(s): "HGBA1C" in the last 72 hours. CBG: Recent Labs  Lab 12/20/22 2113 12/21/22 0803 12/21/22 1215 12/21/22 1513 12/21/22 2121  GLUCAP 122* 138* 133* 153* 103*   Lipid Profile: No results for input(s): "CHOL", "HDL", "LDLCALC", "TRIG", "CHOLHDL", "LDLDIRECT" in the last 72 hours. Thyroid Function Tests: No results for input(s): "TSH", "T4TOTAL", "FREET4", "T3FREE", "THYROIDAB" in the last 72 hours. Anemia Panel: No results for  input(s): "VITAMINB12", "FOLATE", "FERRITIN", "TIBC", "IRON", "RETICCTPCT" in the last 72 hours. Sepsis Labs: No results for input(s): "PROCALCITON", "LATICACIDVEN" in the last 168 hours.  Recent Results (from the past 240 hour(s))  Microscopic Examination     Status: Abnormal   Collection Time: 12/17/22  1:13 PM   Urine  Result Value Ref Range Status   WBC, UA 0-5 0 - 5 /hpf Final   RBC, Urine >30 (A) 0 - 2 /hpf Final   Epithelial Cells (non renal) 0-10 0 - 10 /hpf Final   Mucus, UA Present (A) Not Estab. Final   Bacteria, UA Few None seen/Few Final  CULTURE, URINE COMPREHENSIVE     Status: None (Preliminary result)   Collection Time: 12/17/22  2:25 PM   Specimen: Urine   UR  Result Value Ref Range Status   Urine Culture, Comprehensive Preliminary report  Preliminary   Organism ID, Bacteria Comment  Preliminary    Comment: Microbiological testing to rule out the presence of possible pathogens is in progress.          Radiology Studies: DG Abd 1  View  Result Date: 12/21/2022 CLINICAL DATA:  Constipation EXAM: ABDOMEN - 1 VIEW COMPARISON:  12/18/2022 FINDINGS: Nonobstructive bowel gas pattern. Moderate-large volume stool within the colon, predominantly right-sided. Calcifications projecting over the left renal shadow. No definite calcification over the right renal shadow. No new or acute bony findings. IMPRESSION: 1. Moderate-large volume stool within the colon. 2. Left nephrolithiasis. Electronically Signed   By: Duanne Guess D.O.   On: 12/21/2022 14:54        Scheduled Meds:  felodipine  5 mg Oral Daily   fluticasone  1 spray Each Nare Daily   insulin aspart  0-5 Units Subcutaneous QHS   insulin aspart  0-9 Units Subcutaneous TID WC   loratadine  10 mg Oral Daily   montelukast  10 mg Oral QHS   polyethylene glycol  17 g Oral Daily   senna-docusate  1 tablet Oral BID   simvastatin  40 mg Oral q1800   tamsulosin  0.4 mg Oral Daily   Continuous Infusions:   lactated ringers 75 mL/hr at 12/21/22 1226   promethazine (PHENERGAN) injection (IM or IVPB)       LOS: 4 days     Tresa Moore, MD [facility]   If 7PM-7AM, please contact night-coverage  12/22/2022, 11:45 AM

## 2022-12-22 NOTE — Progress Notes (Signed)
Transition of Care [facility]) - Inpatient Brief Assessment   Patient Details  Name: Bradley Lynch MRN: [id number] Date of Birth: [birth year]  Transition of Care [facility]) CM/SW Contact:    Darolyn Rua, LCSW Phone Number: 12/22/2022, 1:55 PM   Clinical Narrative:  Patient from home with wife,  presents to ED with right flank pain, right UPJ obstructive kidney stone.   PCP Laren Everts, NP Insurance: [employer] state health PPO  No identified TOC needs at this time, please consult TOC should need arise.    Transition of Care Asessment: Insurance and Status: Insurance coverage has been reviewed Patient has primary care physician: Yes Home environment has been reviewed: from home with wife Prior level of function:: independent Prior/Current Home Services: No current home services Social Determinants of Health Reivew: SDOH reviewed no interventions necessary Readmission risk has been reviewed: Yes Transition of care needs: no transition of care needs at this time

## 2022-12-22 NOTE — Plan of Care (Signed)
  Problem: Education: Goal: Ability to describe self-care measures that may prevent or decrease complications (Diabetes Survival Skills Education) will improve Outcome: Progressing Goal: Individualized Educational Video(s) Outcome: Progressing   

## 2022-12-23 DIAGNOSIS — T148XXA Other injury of unspecified body region, initial encounter: Secondary | ICD-10-CM | POA: Diagnosis not present

## 2022-12-23 LAB — GLUCOSE, CAPILLARY: Glucose-Capillary: 99 mg/dL (ref 70–99)

## 2022-12-23 MED ORDER — SIMETHICONE 80 MG PO CHEW
80.0000 mg | CHEWABLE_TABLET | Freq: Four times a day (QID) | ORAL | Status: DC
  Administered 2022-12-23: 80 mg via ORAL
  Filled 2022-12-23: qty 1

## 2022-12-23 MED ORDER — HYDROMORPHONE HCL 2 MG PO TABS
2.0000 mg | ORAL_TABLET | Freq: Two times a day (BID) | ORAL | 0 refills | Status: AC | PRN
Start: 2022-12-23 — End: 2022-12-31

## 2022-12-23 MED ORDER — SIMETHICONE 80 MG PO CHEW: 80.0000 mg | CHEWABLE_TABLET | Freq: Four times a day (QID) | ORAL | 0 refills | Status: DC

## 2022-12-23 MED ORDER — SENNOSIDES-DOCUSATE SODIUM 8.6-50 MG PO TABS: 1.0000 | ORAL_TABLET | Freq: Two times a day (BID) | ORAL | 0 refills | Status: AC

## 2022-12-23 NOTE — Discharge Summary (Signed)
Physician Discharge Summary  Bradley Lynch NWG:[id number] DOB: 05-May-1965 DOA: 12/18/2022  PCP: Luciana Axe, NP  Admit date: 12/18/2022 Discharge date: 12/23/2022  Admitted From: Home Disposition:  Home  Recommendations for Outpatient Follow-up:  Follow up with PCP in 1-2 weeks Follow up with urology as directed  Home Health: No Equipment/Devices: None  Discharge Condition: Stable CODE STATUS: Full Diet recommendation: Carb mod  Brief/Interim Summary:     57 y.o. male with medical history significant of hypertension, hyperlipidemia, diabetes mellitus, BPH, obesity, right UPJ obstructive stone, s/p of lithotripsy, who presents with right flank pain.   Due to right UPJ obstructive kidney stone, patient underwent ESWL lithotripsy by Dr. Apolinar Junes of urology today.  After procedure, patient developed right flank pain, which is constant, sharp, severe, nonradiating, associated with hematuria.  No dysuria or burning on urination.  No fever or chills.  Not aggravated or alleviated by any known factors.  No chest pain, cough, shortness of breath.  No nausea, vomiting, diarrhea or abdominal pain.   Seen in consultation by urology.  Recommend intravenous fluids, pain control, antiemetics.   Hospital course complicated by constipation worsening pain.  Patient underwent several enemas and had successful BM on 9/23.  Pain much improved on 9/24    Discharge Diagnoses:  Principal Problem:   Hematoma_right renal subscapular hematoma Active Problems:   Obstruction of right ureteropelvic junction (UPJ) due to stone   Hypertension   Hyperlipidemia   Diabetes mellitus without complication (HCC)   BPH (benign prostatic hyperplasia)   Obesity (BMI 30-39.9)   Intractable pain   Hematoma_right renal subscapular hematoma and obstruction of right ureteropelvic junction (UPJ) due to stone: CT scan showed subcapsular hematoma.  Hemoglobin stable.  Urology consulted.  Patient admitted to medicine  for treatment of intractable pain.  Hemoglobin and kidney function have improved but patient remains in significant pain Plan: Continue multimodal pain control.  Avoid narcotics as able.  Continue Flomax on DC.  Stable for discharge home.  Follow-up outpatient PCP and urology.   Intractable pain Secondary to subcapsular hematoma Pain improved BM   Constipation Multimodal bowel regimen Several Fleet enemas.  Successful BM on 9/23.   AKI Kidney function normalized    Discharge Instructions  Discharge Instructions     Diet - low sodium heart healthy   Complete by: As directed    Increase activity slowly   Complete by: As directed       Allergies as of 12/23/2022       Reactions   Oxycodone Nausea And Vomiting   If taken for more than a day, it causes GI issues   Iodinated Contrast Media    Other Reaction(s): Unknown        Medication List     STOP taking these medications    lisinopril-hydrochlorothiazide 10-12.5 MG tablet Commonly known as: ZESTORETIC       TAKE these medications    aspirin EC 81 MG tablet Take 81 mg by mouth daily.   cetirizine 10 MG tablet Commonly known as: ZYRTEC Take 10 mg by mouth daily.   Cholecalciferol 125 MCG (5000 UT) Tabs Take 5,000 Units by mouth daily.   cyanocobalamin 1000 MCG tablet Commonly known as: VITAMIN B12 Take 1,000 mcg by mouth daily.   felodipine 5 MG 24 hr tablet Commonly known as: PLENDIL Take 5 mg by mouth daily.   FISH OIL PO Take by mouth.   fluticasone 50 MCG/ACT nasal spray Commonly known as: FLONASE Place 1 spray into  both nostrils daily as needed for allergies.   HYDROmorphone 2 MG tablet Commonly known as: Dilaudid Take 1 tablet (2 mg total) by mouth every 12 (twelve) hours as needed for up to 8 days for severe pain.   metFORMIN 500 MG 24 hr tablet Commonly known as: GLUCOPHAGE-XR Take 500 mg by mouth daily with breakfast.   montelukast 10 MG tablet Commonly known as: SINGULAIR Take  10 mg by mouth at bedtime.   senna-docusate 8.6-50 MG tablet Commonly known as: Senokot-S Take 1 tablet by mouth 2 (two) times daily for 14 days.   simethicone 80 MG chewable tablet Commonly known as: MYLICON Chew 1 tablet (80 mg total) by mouth 4 (four) times daily.   simvastatin 40 MG tablet Commonly known as: ZOCOR Take 40 mg by mouth daily at 6 PM.   tadalafil 20 MG tablet Commonly known as: CIALIS Take 1 tablet (20 mg total) by mouth every other day.   tamsulosin 0.4 MG Caps capsule Commonly known as: Flomax Take 1 capsule (0.4 mg total) by mouth daily.        Allergies  Allergen Reactions   Oxycodone Nausea And Vomiting    If taken for more than a day, it causes GI issues   Iodinated Contrast Media     Other Reaction(s): Unknown    Consultations: Urology   Procedures/Studies: DG Abd 1 View  Result Date: 12/21/2022 CLINICAL DATA:  Constipation EXAM: ABDOMEN - 1 VIEW COMPARISON:  12/18/2022 FINDINGS: Nonobstructive bowel gas pattern. Moderate-large volume stool within the colon, predominantly right-sided. Calcifications projecting over the left renal shadow. No definite calcification over the right renal shadow. No new or acute bony findings. IMPRESSION: 1. Moderate-large volume stool within the colon. 2. Left nephrolithiasis. Electronically Signed   By: Duanne Guess D.O.   On: 12/21/2022 14:54   CT Renal Stone Study  Result Date: 12/18/2022 CLINICAL DATA:  Abdominal and flank pain EXAM: CT ABDOMEN AND PELVIS WITHOUT CONTRAST TECHNIQUE: Multidetector CT imaging of the abdomen and pelvis was performed following the standard protocol without IV contrast. RADIATION DOSE REDUCTION: This exam was performed according to the departmental dose-optimization program which includes automated exposure control, adjustment of the mA and/or kV according to patient size and/or use of iterative reconstruction technique. COMPARISON:  CT of the pelvis 09/14/2007 FINDINGS: Lower  chest: No acute abnormality. Hepatobiliary: No focal liver abnormality is seen. No gallstones, gallbladder wall thickening, or biliary dilatation. Pancreas: Unremarkable. No pancreatic ductal dilatation or surrounding inflammatory changes. Spleen: Normal in size without focal abnormality. Adrenals/Urinary Tract: There are punctate nonobstructing bilateral renal calculi. Linear calcifications are seen in the right renal pelvis and renal collecting system. There is no hydronephrosis. No ureteral or bladder calculi are seen. There is heterogeneous perinephric high density subcapsular fluid collection surrounding the entire kidney measuring up to 4.8 cm in thickness. Renal hilum is spared. There is a additional fluid, stranding and hyperdense fluid in the right perirenal and pararenal space minimally extending along the right retroperitoneum into the pelvis. The adrenal glands and bladder are within normal limits. There is no left-sided hydronephrosis or perinephric fluid collection. Stomach/Bowel: Stomach is within normal limits. Appendix appears normal. No evidence of bowel wall thickening, distention, or inflammatory changes. There is sigmoid colon diverticulosis. Vascular/Lymphatic: Aortic atherosclerosis. No enlarged abdominal or pelvic lymph nodes. Reproductive: Prostate is unremarkable. Other: There is a small fat containing umbilical hernia. There is no ascites. Musculoskeletal: Posterior fusion hardware is seen at L5-S1 and L4. There is chronic appearing anterolisthesis  at L5-S1. IMPRESSION: 1. Large right perinephric fluid collection/hematoma measuring up to 4.8 cm in thickness. There is additional fluid, stranding and hyperdense fluid in the right perirenal and pararenal space minimally extending along the right retroperitoneum into the pelvis. Given the size and distribution of this subcapsular hematoma, patient is at risk for Page kidney. Recommend clinical correlation and follow-up. 2. Nonobstructing  bilateral renal calculi with linear calcifications in the right renal pelvis and renal collecting system. 3. Sigmoid colon diverticulosis. 4. Small fat containing umbilical hernia. Aortic Atherosclerosis (ICD10-I70.0). Electronically Signed   By: Darliss Cheney M.D.   On: 12/18/2022 21:38   DG Abd 1 View  Result Date: 12/18/2022 CLINICAL DATA:  [id number] Right ureteral stone [id number]. EXAM: ABDOMEN - 1 VIEW COMPARISON:  Abdominal radiograph 12/17/2022. FINDINGS: Calculi project over the medial aspect of the right renal shadow and lower aspect of the left renal shadow. Prior L5-S1 posterior spinal fusion. Normal bowel-gas pattern. IMPRESSION: Bilateral nephrolithiasis. Electronically Signed   By: Orvan Falconer M.D.   On: 12/18/2022 13:00      Subjective: Seen and examined on the day of discharge.  Stable no distress.  Appropriate for discharge home.  Discharge Exam: Vitals:   12/23/22 0330 12/23/22 0748  BP: (!) 140/88 (!) 159/96  Pulse: 72 67  Resp: 18 18  Temp: 98.6 F (37 C) 97.8 F (36.6 C)  SpO2: 94% 93%   Vitals:   12/22/22 0755 12/22/22 1947 12/23/22 0330 12/23/22 0748  BP: (!) 151/87 (!) 144/91 (!) 140/88 (!) 159/96  Pulse: 77 78 72 67  Resp: 18 18 18 18   Temp: 98.4 F (36.9 C) 98.6 F (37 C) 98.6 F (37 C) 97.8 F (36.6 C)  TempSrc:  Oral  Oral  SpO2: 95% 92% 94% 93%  Weight:      Height:        General: Pt is alert, awake, not in acute distress Cardiovascular: RRR, S1/S2 +, no rubs, no gallops Respiratory: CTA bilaterally, no wheezing, no rhonchi Abdominal: Soft, NT, ND, bowel sounds + Extremities: no edema, no cyanosis    The results of significant diagnostics from this hospitalization (including imaging, microbiology, ancillary and laboratory) are listed below for reference.     Microbiology: Recent Results (from the past 240 hour(s))  Microscopic Examination     Status: Abnormal   Collection Time: 12/17/22  1:13 PM   Urine  Result Value Ref Range Status    WBC, UA 0-5 0 - 5 /hpf Final   RBC, Urine >30 (A) 0 - 2 /hpf Final   Epithelial Cells (non renal) 0-10 0 - 10 /hpf Final   Mucus, UA Present (A) Not Estab. Final   Bacteria, UA Few None seen/Few Final  CULTURE, URINE COMPREHENSIVE     Status: None (Preliminary result)   Collection Time: 12/17/22  2:25 PM   Specimen: Urine   UR  Result Value Ref Range Status   Urine Culture, Comprehensive Preliminary report  Preliminary   Organism ID, Bacteria Comment  Preliminary    Comment: Microbiological testing to rule out the presence of possible pathogens is in progress.      Labs: BNP (last 3 results) No results for input(s): "BNP" in the last 8760 hours. Basic Metabolic Panel: Recent Labs  Lab 12/18/22 2038 12/19/22 0546 12/20/22 0809 12/21/22 0810 12/22/22 0716  NA 135 136 132* 134* 137  K 3.7 4.0 4.1 4.4 4.1  CL 98 98 98 99 101  CO2 26 28 27 28  28  GLUCOSE 190* 155* 128* 145* 130*  BUN 22* 28* 33* 27* 21*  CREATININE 1.26* 1.82* 1.36* 1.06 1.00  CALCIUM 8.6* 8.0* 8.4* 8.5* 8.6*   Liver Function Tests: Recent Labs  Lab 12/18/22 2038  AST 21  ALT 20  ALKPHOS 54  BILITOT 1.1  PROT 6.4*  ALBUMIN 3.6   No results for input(s): "LIPASE", "AMYLASE" in the last 168 hours. No results for input(s): "AMMONIA" in the last 168 hours. CBC: Recent Labs  Lab 12/18/22 2038 12/19/22 0546 12/19/22 1521 12/19/22 1752 12/20/22 0809 12/21/22 0810 12/22/22 0716  WBC 11.9*   < > 10.9* 9.6 9.2 9.6 9.1  NEUTROABS 10.0*  --   --   --  5.8 6.8 6.5  HGB 14.5   < > 12.2* 11.6* 11.6* 11.2* 10.9*  HCT 43.5   < > 36.0* 34.9* 35.1* 33.6* 32.0*  MCV 90.1   < > 89.8 91.4 91.4 90.8 88.6  PLT 271   < > 235 226 224 228 261   < > = values in this interval not displayed.   Cardiac Enzymes: No results for input(s): "CKTOTAL", "CKMB", "CKMBINDEX", "TROPONINI" in the last 168 hours. BNP: Invalid input(s): "POCBNP" CBG: Recent Labs  Lab 12/21/22 1215 12/21/22 1513 12/21/22 2121  12/22/22 1754 12/23/22 0746  GLUCAP 133* 153* 103* 116* 99   D-Dimer No results for input(s): "DDIMER" in the last 72 hours. Hgb A1c No results for input(s): "HGBA1C" in the last 72 hours. Lipid Profile No results for input(s): "CHOL", "HDL", "LDLCALC", "TRIG", "CHOLHDL", "LDLDIRECT" in the last 72 hours. Thyroid function studies No results for input(s): "TSH", "T4TOTAL", "T3FREE", "THYROIDAB" in the last 72 hours.  Invalid input(s): "FREET3" Anemia work up No results for input(s): "VITAMINB12", "FOLATE", "FERRITIN", "TIBC", "IRON", "RETICCTPCT" in the last 72 hours. Urinalysis    Component Value Date/Time   APPEARANCEUR Cloudy (A) 12/17/2022 1313   GLUCOSEU Negative 12/17/2022 1313   BILIRUBINUR Negative 12/17/2022 1313   PROTEINUR 1+ (A) 12/17/2022 1313   NITRITE Negative 12/17/2022 1313   LEUKOCYTESUR Negative 12/17/2022 1313   Sepsis Labs Recent Labs  Lab 12/19/22 1752 12/20/22 0809 12/21/22 0810 12/22/22 0716  WBC 9.6 9.2 9.6 9.1   Microbiology Recent Results (from the past 240 hour(s))  Microscopic Examination     Status: Abnormal   Collection Time: 12/17/22  1:13 PM   Urine  Result Value Ref Range Status   WBC, UA 0-5 0 - 5 /hpf Final   RBC, Urine >30 (A) 0 - 2 /hpf Final   Epithelial Cells (non renal) 0-10 0 - 10 /hpf Final   Mucus, UA Present (A) Not Estab. Final   Bacteria, UA Few None seen/Few Final  CULTURE, URINE COMPREHENSIVE     Status: None (Preliminary result)   Collection Time: 12/17/22  2:25 PM   Specimen: Urine   UR  Result Value Ref Range Status   Urine Culture, Comprehensive Preliminary report  Preliminary   Organism ID, Bacteria Comment  Preliminary    Comment: Microbiological testing to rule out the presence of possible pathogens is in progress.      Time coordinating discharge: Over 30 minutes  SIGNED:   Tresa Moore, MD  [facility] 12/23/2022, 11:18 AM Pager   If 7PM-7AM, please contact night-coverage

## 2022-12-23 NOTE — Plan of Care (Signed)
  Problem: Pain Managment: Goal: General experience of comfort will improve Outcome: Progressing   Problem: Safety: Goal: Ability to remain free from injury will improve Outcome: Progressing   

## 2022-12-23 NOTE — Progress Notes (Signed)
Discharge instructions reviewed with patient including followup visits and new medications/medication changes.  Understanding was verbalized and all questions were answered.  IV removed without complication; patient tolerated well.  Patient awaiting ride.

## 2022-12-24 LAB — CULTURE, URINE COMPREHENSIVE

## 2022-12-29 ENCOUNTER — Encounter: Payer: Self-pay | Admitting: Urology

## 2023-01-08 ENCOUNTER — Encounter: Payer: Self-pay | Admitting: Physician Assistant

## 2023-01-08 ENCOUNTER — Ambulatory Visit
Admission: RE | Admit: 2023-01-08 | Discharge: 2023-01-08 | Disposition: A | Discharge status: Home / Self Care | Payer: BC Managed Care – PPO | Source: Ambulatory Visit | Attending: Physician Assistant | Admitting: Physician Assistant

## 2023-01-08 ENCOUNTER — Ambulatory Visit
Admission: RE | Admit: 2023-01-08 | Discharge: 2023-01-08 | Disposition: A | Discharge status: Home / Self Care | Payer: BC Managed Care – PPO | Attending: Physician Assistant | Admitting: Physician Assistant

## 2023-01-08 ENCOUNTER — Ambulatory Visit: Payer: BC Managed Care – PPO | Admitting: Physician Assistant

## 2023-01-08 VITALS — BP 159/83 | HR 79 | Ht 71.0 in | Wt 225.0 lb

## 2023-01-08 DIAGNOSIS — S37011A Minor contusion of right kidney, initial encounter: Secondary | ICD-10-CM | POA: Diagnosis not present

## 2023-01-08 DIAGNOSIS — N201 Calculus of ureter: Secondary | ICD-10-CM

## 2023-01-08 DIAGNOSIS — Z87442 Personal history of urinary calculi: Secondary | ICD-10-CM

## 2023-01-08 DIAGNOSIS — T148XXA Other injury of unspecified body region, initial encounter: Secondary | ICD-10-CM

## 2023-01-08 LAB — URINALYSIS, COMPLETE
Bilirubin, UA: NEGATIVE
Glucose, UA: NEGATIVE
Ketones, UA: NEGATIVE
Leukocytes,UA: NEGATIVE
Nitrite, UA: NEGATIVE
Protein,UA: NEGATIVE
RBC, UA: NEGATIVE
Specific Gravity, UA: 1.02 (ref 1.005–1.030)
Urobilinogen, Ur: 1 mg/dL (ref 0.2–1.0)
pH, UA: 6 (ref 5.0–7.5)

## 2023-01-08 LAB — MICROSCOPIC EXAMINATION

## 2023-04-20 ENCOUNTER — Other Ambulatory Visit: Payer: Self-pay

## 2023-04-20 DIAGNOSIS — N200 Calculus of kidney: Secondary | ICD-10-CM

## 2023-04-21 ENCOUNTER — Ambulatory Visit: Payer: BC Managed Care – PPO | Admitting: Urology

## 2023-04-24 ENCOUNTER — Telehealth: Payer: Self-pay

## 2023-04-24 NOTE — Telephone Encounter (Signed)
Pt states that after he had a ESWL he was it the hospital for 6 days due to a bruise on his kidney and the amount of pain he was in. He states that he has had 11 ESWL and never had such pain or had to be in the hospital after.   He feels that he should not have to pay for the ESWL because the care he received from [facility] was not correct.  Per the pt- She did not speak to him or his wife before, during, or after the ESWL. He called the office and she did not speak to him. CMA consulted with [facility] who stated that if he had taken is pain meds and was still in a lot of pain he needed to go to he ED. See telephone note.   SV saw him in the hospital and at a f/u visi in the office.  He states that went fine.   He wants me to take care of his [place] bill. Advised we do not bill for [facility]. He will need to contact them.  Pt voiced understanding.    I apologized to the pt for not being able to help him.

## 2023-04-24 NOTE — Telephone Encounter (Signed)
-----   Message from [facility] Melissa H sent at 04/23/2023  4:24 PM EST ----- Patient left a very long upset message asking him for the "boss" to call him without any context.   Thanks
